# Patient Record
Sex: Female | Born: 1945 | Race: White | Marital: Married | Smoking: Never smoker
Health system: Southern US, Community
[De-identification: ages and names within clinical notes are randomized; demographics above are authoritative.]

## PROBLEM LIST (undated history)

## (undated) DIAGNOSIS — I10 Essential (primary) hypertension: Secondary | ICD-10-CM

## (undated) DIAGNOSIS — M1990 Unspecified osteoarthritis, unspecified site: Secondary | ICD-10-CM

## (undated) DIAGNOSIS — G43909 Migraine, unspecified, not intractable, without status migrainosus: Secondary | ICD-10-CM

## (undated) DIAGNOSIS — K589 Irritable bowel syndrome without diarrhea: Secondary | ICD-10-CM

## (undated) DIAGNOSIS — Z860100 Personal history of colon polyps, unspecified: Secondary | ICD-10-CM

## (undated) DIAGNOSIS — N189 Chronic kidney disease, unspecified: Secondary | ICD-10-CM

## (undated) DIAGNOSIS — Z803 Family history of malignant neoplasm of breast: Secondary | ICD-10-CM

## (undated) DIAGNOSIS — Z808 Family history of malignant neoplasm of other organs or systems: Secondary | ICD-10-CM

## (undated) DIAGNOSIS — G8929 Other chronic pain: Secondary | ICD-10-CM

## (undated) DIAGNOSIS — Z8041 Family history of malignant neoplasm of ovary: Secondary | ICD-10-CM

## (undated) DIAGNOSIS — Z800 Family history of malignant neoplasm of digestive organs: Secondary | ICD-10-CM

## (undated) DIAGNOSIS — M419 Scoliosis, unspecified: Secondary | ICD-10-CM

## (undated) DIAGNOSIS — M545 Low back pain, unspecified: Secondary | ICD-10-CM

## (undated) DIAGNOSIS — R519 Headache, unspecified: Secondary | ICD-10-CM

## (undated) DIAGNOSIS — Z8481 Family history of carrier of genetic disease: Secondary | ICD-10-CM

## (undated) DIAGNOSIS — F419 Anxiety disorder, unspecified: Secondary | ICD-10-CM

## (undated) HISTORY — DX: Headache, unspecified: R51.9

## (undated) HISTORY — PX: BREAST EXCISIONAL BIOPSY: SUR124

## (undated) HISTORY — DX: Low back pain: M54.5

## (undated) HISTORY — DX: Anxiety disorder, unspecified: F41.9

## (undated) HISTORY — DX: Personal history of colon polyps, unspecified: Z86.0100

## (undated) HISTORY — DX: Family history of malignant neoplasm of breast: Z80.3

## (undated) HISTORY — PX: VITRECTOMY: SHX106

## (undated) HISTORY — PX: HIP SURGERY: SHX245

## (undated) HISTORY — DX: Family history of malignant neoplasm of other organs or systems: Z80.8

## (undated) HISTORY — DX: Low back pain, unspecified: M54.50

## (undated) HISTORY — PX: TROCHANTERIC BURSA EXCISION: SHX2581

## (undated) HISTORY — DX: Scoliosis, unspecified: M41.9

## (undated) HISTORY — DX: Other chronic pain: G89.29

## (undated) HISTORY — DX: Family history of malignant neoplasm of digestive organs: Z80.0

## (undated) HISTORY — DX: Chronic kidney disease, unspecified: N18.9

## (undated) HISTORY — DX: Irritable bowel syndrome, unspecified: K58.9

## (undated) HISTORY — PX: APPENDECTOMY: SHX54

## (undated) HISTORY — PX: TUBAL LIGATION: SHX77

## (undated) HISTORY — PX: CATARACT EXTRACTION: SUR2

## (undated) HISTORY — PX: KNEE SURGERY: SHX244

## (undated) HISTORY — DX: Family history of carrier of genetic disease: Z84.81

## (undated) HISTORY — PX: CARPAL TUNNEL RELEASE: SHX101

## (undated) HISTORY — DX: Family history of malignant neoplasm of ovary: Z80.41

## (undated) HISTORY — DX: Personal history of colonic polyps: Z86.010

## (undated) HISTORY — PX: BUNIONECTOMY: SHX129

---

## 2017-01-07 DIAGNOSIS — K3189 Other diseases of stomach and duodenum: Secondary | ICD-10-CM | POA: Diagnosis not present

## 2017-01-07 HISTORY — PX: UPPER GASTROINTESTINAL ENDOSCOPY: SHX188

## 2017-01-07 HISTORY — PX: COLONOSCOPY: SHX5424

## 2017-02-11 DIAGNOSIS — K649 Unspecified hemorrhoids: Secondary | ICD-10-CM | POA: Diagnosis not present

## 2017-10-01 DIAGNOSIS — G43709 Chronic migraine without aura, not intractable, without status migrainosus: Secondary | ICD-10-CM | POA: Diagnosis not present

## 2017-10-20 DIAGNOSIS — R634 Abnormal weight loss: Secondary | ICD-10-CM | POA: Diagnosis not present

## 2017-10-20 DIAGNOSIS — G43909 Migraine, unspecified, not intractable, without status migrainosus: Secondary | ICD-10-CM | POA: Diagnosis not present

## 2017-10-20 DIAGNOSIS — R7989 Other specified abnormal findings of blood chemistry: Secondary | ICD-10-CM | POA: Diagnosis not present

## 2017-10-20 DIAGNOSIS — E559 Vitamin D deficiency, unspecified: Secondary | ICD-10-CM | POA: Diagnosis not present

## 2017-10-20 DIAGNOSIS — N289 Disorder of kidney and ureter, unspecified: Secondary | ICD-10-CM | POA: Diagnosis not present

## 2017-10-20 DIAGNOSIS — R195 Other fecal abnormalities: Secondary | ICD-10-CM | POA: Diagnosis not present

## 2017-10-22 DIAGNOSIS — R7989 Other specified abnormal findings of blood chemistry: Secondary | ICD-10-CM | POA: Diagnosis not present

## 2017-11-02 DIAGNOSIS — Z049 Encounter for examination and observation for unspecified reason: Secondary | ICD-10-CM | POA: Diagnosis not present

## 2017-11-02 DIAGNOSIS — G43719 Chronic migraine without aura, intractable, without status migrainosus: Secondary | ICD-10-CM | POA: Diagnosis not present

## 2017-11-10 DIAGNOSIS — R51 Headache: Secondary | ICD-10-CM | POA: Diagnosis not present

## 2017-11-10 DIAGNOSIS — G43719 Chronic migraine without aura, intractable, without status migrainosus: Secondary | ICD-10-CM | POA: Diagnosis not present

## 2017-11-10 DIAGNOSIS — G518 Other disorders of facial nerve: Secondary | ICD-10-CM | POA: Diagnosis not present

## 2017-11-10 DIAGNOSIS — M791 Myalgia, unspecified site: Secondary | ICD-10-CM | POA: Diagnosis not present

## 2017-11-10 DIAGNOSIS — M542 Cervicalgia: Secondary | ICD-10-CM | POA: Diagnosis not present

## 2017-11-12 ENCOUNTER — Other Ambulatory Visit: Payer: Self-pay | Admitting: Specialist

## 2017-11-12 DIAGNOSIS — M542 Cervicalgia: Secondary | ICD-10-CM

## 2017-11-17 ENCOUNTER — Ambulatory Visit
Admission: RE | Admit: 2017-11-17 | Discharge: 2017-11-17 | Disposition: A | Discharge status: Home / Self Care | Payer: Medicare Other | Source: Ambulatory Visit | Attending: Specialist | Admitting: Specialist

## 2017-11-17 DIAGNOSIS — M4802 Spinal stenosis, cervical region: Secondary | ICD-10-CM | POA: Diagnosis not present

## 2017-11-17 DIAGNOSIS — M542 Cervicalgia: Secondary | ICD-10-CM

## 2017-11-29 DIAGNOSIS — G518 Other disorders of facial nerve: Secondary | ICD-10-CM | POA: Diagnosis not present

## 2017-11-29 DIAGNOSIS — M791 Myalgia, unspecified site: Secondary | ICD-10-CM | POA: Diagnosis not present

## 2017-11-29 DIAGNOSIS — G43719 Chronic migraine without aura, intractable, without status migrainosus: Secondary | ICD-10-CM | POA: Diagnosis not present

## 2017-11-29 DIAGNOSIS — M542 Cervicalgia: Secondary | ICD-10-CM | POA: Diagnosis not present

## 2017-11-29 DIAGNOSIS — R51 Headache: Secondary | ICD-10-CM | POA: Diagnosis not present

## 2017-12-14 DIAGNOSIS — R51 Headache: Secondary | ICD-10-CM | POA: Diagnosis not present

## 2017-12-14 DIAGNOSIS — M791 Myalgia, unspecified site: Secondary | ICD-10-CM | POA: Diagnosis not present

## 2017-12-14 DIAGNOSIS — G43719 Chronic migraine without aura, intractable, without status migrainosus: Secondary | ICD-10-CM | POA: Diagnosis not present

## 2017-12-14 DIAGNOSIS — G518 Other disorders of facial nerve: Secondary | ICD-10-CM | POA: Diagnosis not present

## 2017-12-14 DIAGNOSIS — M542 Cervicalgia: Secondary | ICD-10-CM | POA: Diagnosis not present

## 2017-12-23 ENCOUNTER — Other Ambulatory Visit: Payer: Self-pay | Admitting: Internal Medicine

## 2017-12-23 DIAGNOSIS — R195 Other fecal abnormalities: Secondary | ICD-10-CM | POA: Diagnosis not present

## 2017-12-23 DIAGNOSIS — G43909 Migraine, unspecified, not intractable, without status migrainosus: Secondary | ICD-10-CM | POA: Diagnosis not present

## 2017-12-23 DIAGNOSIS — R634 Abnormal weight loss: Secondary | ICD-10-CM | POA: Diagnosis not present

## 2017-12-23 DIAGNOSIS — N289 Disorder of kidney and ureter, unspecified: Secondary | ICD-10-CM

## 2017-12-23 DIAGNOSIS — E559 Vitamin D deficiency, unspecified: Secondary | ICD-10-CM | POA: Diagnosis not present

## 2018-01-01 ENCOUNTER — Encounter: Payer: Self-pay | Admitting: *Deleted

## 2018-01-01 ENCOUNTER — Emergency Department (INDEPENDENT_AMBULATORY_CARE_PROVIDER_SITE_OTHER): Payer: Medicare Other

## 2018-01-01 ENCOUNTER — Emergency Department (INDEPENDENT_AMBULATORY_CARE_PROVIDER_SITE_OTHER)
Admission: EM | Admit: 2018-01-01 | Discharge: 2018-01-01 | Disposition: A | Discharge status: Home / Self Care | Payer: Medicare Other | Source: Home / Self Care | Attending: Emergency Medicine | Admitting: Emergency Medicine

## 2018-01-01 ENCOUNTER — Other Ambulatory Visit: Payer: Self-pay

## 2018-01-01 DIAGNOSIS — W19XXXA Unspecified fall, initial encounter: Secondary | ICD-10-CM

## 2018-01-01 DIAGNOSIS — M4185 Other forms of scoliosis, thoracolumbar region: Secondary | ICD-10-CM

## 2018-01-01 DIAGNOSIS — S2231XA Fracture of one rib, right side, initial encounter for closed fracture: Secondary | ICD-10-CM

## 2018-01-01 HISTORY — DX: Essential (primary) hypertension: I10

## 2018-01-01 HISTORY — DX: Migraine, unspecified, not intractable, without status migrainosus: G43.909

## 2018-01-01 HISTORY — DX: Unspecified osteoarthritis, unspecified site: M19.90

## 2018-01-01 MED ORDER — KETOROLAC TROMETHAMINE 60 MG/2ML IM SOLN
60.0000 mg | Freq: Once | INTRAMUSCULAR | Status: AC
  Administered 2018-01-01: 60 mg via INTRAMUSCULAR

## 2018-01-01 MED ORDER — TRAMADOL HCL 50 MG PO TABS: 50.0000 mg | ORAL_TABLET | Freq: Four times a day (QID) | ORAL | 0 refills | Status: DC | PRN

## 2018-01-01 MED ORDER — IBUPROFEN 800 MG PO TABS: 800.0000 mg | ORAL_TABLET | Freq: Three times a day (TID) | ORAL | 0 refills | Status: DC | PRN

## 2018-01-01 NOTE — ED Provider Notes (Addendum)
[facility]    CSN: [id number] Arrival date & time: 01/01/18  1541     History   Chief Complaint Chief Complaint  Patient presents with  . Chest Pain    Right    HPI Rebecca Huang is a 72 y.o. female.   The history is provided by the patient.  Chest Pain  Pain location:  R lateral chest Pain quality: sharp   Pain radiates to:  Does not radiate Pain severity:  Severe Onset quality:  Sudden Progression:  Unchanged Chronicity:  New Context: trauma   Context comment:  Accidentally fell from bathtub striking right side chest Relieved by:  Nothing Worsened by:  Deep breathing, movement and certain positions Associated symptoms: no abdominal pain, no altered mental status, no back pain, no cough, no diaphoresis, no dizziness, no dysphagia, no fever, no headache, no lower extremity edema, no nausea, no near-syncope, no numbness, no palpitations, no shortness of breath, no syncope, no vomiting and no weakness   Injury occurred today.  No loss of consciousness.  No other musculoskeletal complaints.  No arm or leg pain.  Denies head or neck injury.  Past Medical History:  Diagnosis Date  . Arthritis   . Hypertension   . Migraine     There are no active problems to display for this patient.   Past Surgical History:  Procedure Laterality Date  . APPENDECTOMY    . HIP SURGERY    . KNEE SURGERY    . TUBAL LIGATION      OB History    No data available       Home Medications    Prior to Admission medications   Medication Sig Start Date End Date Taking? Authorizing Provider  aspirin EC 81 MG tablet Take 81 mg by mouth daily.   Yes [provider]  Calcium Carbonate-Vit D-Min (CALCIUM 1200 PO) Take by mouth.   Yes [provider]  fluticasone (FLONASE) 50 MCG/ACT nasal spray Place into both nostrils daily.   Yes [provider]  hydrochlorothiazide (HYDRODIURIL) 12.5 MG tablet Take 25 mg by mouth daily.   Yes [provider]  Magnesium 400 MG CAPS Take by mouth.   Yes [provider]  Multiple Vitamin (MULTIVITAMIN) tablet Take 1 tablet by mouth daily.   Yes [provider]  Omega-3 Fatty Acids (FISH OIL BURP-LESS PO) Take by mouth.   Yes [provider]  potassium gluconate (TH POTASSIUM GLUCONATE) 595 (99 K) MG TABS tablet Take 595 mg by mouth.   Yes [provider]  Riboflavin (B2) 100 MG TABS Take by mouth.   Yes [provider]  zolmitriptan (ZOMIG) 5 MG tablet Take 5 mg by mouth as needed for migraine.   Yes [provider]  ibuprofen (ADVIL,MOTRIN) 800 MG tablet Take 1 tablet (800 mg total) by mouth every 8 (eight) hours as needed. Take with food. As needed for moderate pain 01/01/18   Jacqulyn Cane, MD  traMADol (ULTRAM) 50 MG tablet Take 1 tablet (50 mg total) by mouth every 6 (six) hours as needed for severe pain. Caution, may cause drowsiness 01/01/18   Jacqulyn Cane, MD    Family History Family History  Problem Relation Age of Onset  . Heart disease Mother   . Heart disease Sister     Social History Social History   Tobacco Use  . Smoking status: Never Smoker  . Smokeless tobacco: Never Used  Substance Use Topics  . Alcohol use: No  Frequency: Never  . Drug use: No     Allergies   Demerol [meperidine hcl]   Review of Systems Review of Systems  Constitutional: Negative for diaphoresis and fever.  HENT: Negative for trouble swallowing.   Respiratory: Negative for cough and shortness of breath.   Cardiovascular: Positive for chest pain. Negative for palpitations, syncope and near-syncope.  Gastrointestinal: Negative for abdominal pain, nausea and vomiting.  Musculoskeletal: Negative for back pain.  Neurological: Negative for dizziness, weakness, numbness and headaches.  All other systems reviewed and are negative.    Physical Exam Triage Vital Signs ED Triage Vitals [01/01/18 1725]  Enc Vitals Group     BP (!) 159/94      Pulse Rate 69     Resp 20     Temp      Temp src      SpO2 98 %     Weight 108 lb (49 kg)     Height      Head Circumference      Peak Flow      Pain Score 10     Pain Loc      Pain Edu?      Excl. in [place]?    No data found.  Updated Vital Signs BP (!) 159/94 (BP Location: Right Arm)   Pulse 69   Resp 20   Wt 108 lb (49 kg)   SpO2 98%   Visual Acuity Right Eye Distance:   Left Eye Distance:   Bilateral Distance:    Right Eye Near:   Left Eye Near:    Bilateral Near:     Physical Exam  Constitutional: She is oriented to person, place, and time. She appears well-developed and well-nourished. No distress.  Very uncomfortable from right-sided chest wall pain.  She splints herself.  No acute cardiorespiratory distress. Oxygen saturation 98 % on room air  HENT:  Head: Normocephalic and atraumatic.  Eyes: Pupils are equal, round, and reactive to light. No scleral icterus.  Neck: Normal range of motion. Neck supple.  Nontender  Cardiovascular: Normal rate and regular rhythm. Exam reveals no gallop.  No murmur heard. Pulmonary/Chest: Effort normal and breath sounds normal. She has no decreased breath sounds. She has no wheezes. She has no rhonchi. She has no rales.  Abdominal: She exhibits no distension.  Musculoskeletal:       Arms: Extremely tender right lateral ribs.  Mild ecchymosis.  Minimal swelling.  No open wound  Neurological: She is alert and oriented to person, place, and time. She has normal strength. No cranial nerve deficit or sensory deficit. Gait normal.  Skin: Skin is warm and dry.  Psychiatric: She has a normal mood and affect. Her behavior is normal.  Vitals reviewed.    UC Treatments / Results  Labs (all labs ordered are listed, but only abnormal results are displayed) Labs Reviewed - No data to display  EKG  EKG Interpretation None       Radiology No results found.  Procedures Procedures (including critical care time)  Medications  Ordered in UC Medications  ketorolac (TORADOL) injection 60 mg (60 mg Intramuscular Given 01/01/18 1723)     Initial Impression / Assessment and Plan / UC Course  I have reviewed the triage vital signs and the nursing notes.  Pertinent labs & imaging results that were available during my care of the patient were reviewed by me and considered in my medical decision making (see chart for details).     Slightly  displaced, closed fracture right lateral eighth rib. Reviewed x-ray and diagnosis with patient. Anticipatory guidance discussed. Treatment options discussed, as well as risks, benefits, alternatives. Patient voiced understanding and agreement with the following plans: Toradol 60 mg IM stat.  She was observed and pain level decreased from 10 out of 10 to 6 out of 10.   Final Clinical Impressions(s) / UC Diagnoses   Final diagnoses:  Closed fracture of one rib of right side, initial encounter    ED Discharge Orders        Ordered    ibuprofen (ADVIL,MOTRIN) 800 MG tablet  Every 8 hours PRN     01/01/18 1807    traMADol (ULTRAM) 50 MG tablet  Every 6 hours PRN     01/01/18 1807     An After Visit Summary was printed and given to the patient. You have "slightly displaced fracture right lateral eighth rib". This can take 8-12+ weeks to completely heal, but the severe pain will likely improve within one week. For severe pain, printed prescription for tramadol, use if needed for severe pain. For moderate pain, prescription for ibuprofen 800 mg, one every 8 hours with food as needed. Wear rib belt for pain relief. Apply ice for 24-48 hours. Please read attached instruction sheet on rib fracture. Follow-up with your doctor in 5 days, or go to emergency room sooner if any severe/worse symptoms.  ibuprofen 800 MG tablet    Take 1 tablet (800 mg total) by mouth every 8 (eight) hours as needed. Take with food. As needed for moderate pain     traMADol 50 MG tablet    #15  No  RF's  Take 1 tablet (50 mg total) by mouth every 6 (six) hours as needed for severe pain. Caution, may cause drowsiness    Before prescribing tramadol, I reviewed her drug allergies.  In the past, Demerol IM postop was associated with nausea and vomiting, however she is states she has taken tramadol in the past without any side effects or reactions.  Controlled Substance Prescriptions [place] Controlled Substance Registry consulted? Yes, I have consulted the [place] Controlled Substances Registry for this patient, and feel the risk/benefit ratio today is favorable for proceeding with this prescription for a controlled substance.   Jacqulyn Cane, MD 01/03/18 Reatha Armour, MD 01/03/18 Penni Bombard    Jacqulyn Cane, MD 01/03/18 [phone]

## 2018-01-01 NOTE — ED Triage Notes (Signed)
Patient reports trying to hang something above her bathtub and fell onto a towel rack. C/o pain at right lateral ribs.

## 2018-01-01 NOTE — Discharge Instructions (Signed)
You have "slightly displaced fracture right lateral eighth rib". This can take 8-12 +weeks to completely heal, but the severe pain will likely improve within one week. For severe pain, printed prescription for tramadol, use if needed for severe pain. For moderate pain, prescription for ibuprofen 800 mg, one every 8 hours with food as needed. Wear rib belt for pain relief. Apply ice for 24-48 hours. Please read attached instruction sheet on rib fracture. Follow-up with your doctor in 5 days, or go to emergency room sooner if any severe/worse symptoms.

## 2018-01-03 ENCOUNTER — Other Ambulatory Visit: Payer: PRIVATE HEALTH INSURANCE

## 2018-01-03 DIAGNOSIS — S2239XA Fracture of one rib, unspecified side, initial encounter for closed fracture: Secondary | ICD-10-CM | POA: Diagnosis not present

## 2018-01-06 ENCOUNTER — Ambulatory Visit
Admission: RE | Admit: 2018-01-06 | Discharge: 2018-01-06 | Disposition: A | Discharge status: Home / Self Care | Payer: Medicare Other | Source: Ambulatory Visit | Attending: Internal Medicine | Admitting: Internal Medicine

## 2018-01-06 DIAGNOSIS — N2889 Other specified disorders of kidney and ureter: Secondary | ICD-10-CM | POA: Diagnosis not present

## 2018-01-06 DIAGNOSIS — N289 Disorder of kidney and ureter, unspecified: Secondary | ICD-10-CM

## 2018-01-27 ENCOUNTER — Ambulatory Visit (INDEPENDENT_AMBULATORY_CARE_PROVIDER_SITE_OTHER): Payer: Medicare Other | Admitting: Neurology

## 2018-01-27 ENCOUNTER — Telehealth: Payer: Self-pay | Admitting: Neurology

## 2018-01-27 ENCOUNTER — Encounter: Payer: Self-pay | Admitting: Neurology

## 2018-01-27 VITALS — BP 148/98 | HR 72 | Ht 64.5 in | Wt 106.0 lb

## 2018-01-27 DIAGNOSIS — G43019 Migraine without aura, intractable, without status migrainosus: Secondary | ICD-10-CM

## 2018-01-27 MED ORDER — ZOLMITRIPTAN 5 MG PO TABS: 5.0000 mg | ORAL_TABLET | ORAL | 5 refills | Status: DC | PRN

## 2018-01-27 NOTE — Patient Instructions (Addendum)
We are going to request insurance authorization for Botox injections for your intractable migraine headaches. In the meantime, please continue to take Zomig as needed and be cautious with the use of over the counter headache medications.  You are on a low dose Zomig. I will refill it at 5 mg: take 1 pill early on when you suspect a migraine attack come on. You may take another pill within 2 hours, no more than 2 pills in 24 hours. No more than 3 pills in a week. Most people who take triptans do not have any serious side-effects. However, they can cause drowsiness (remember to not drive or use heavy machinery when drowsy), nausea, dizziness, dry mouth. Less common side effects include strange sensations, such as tightness in your chest or throat, tingling, flushing, and feelings of heaviness or pressure in areas such as the face, limbs, and chest. These in the chest can mimic heart related pain (angina) and may cause alarm, but usually these sensations are not harmful or a sign of a heart attack. However, if you develop intense chest pain or sensations of discomfort, you should stop taking your medication and consult with me or your PCP or go to the nearest urgent care facility or ER or call 911.

## 2018-01-27 NOTE — Telephone Encounter (Signed)
I called the patient's insurance company of [employer], they follow medicare guidelines Ref#[id number] 01/27/18.

## 2018-01-27 NOTE — Progress Notes (Signed)
Subjective:    Patient ID: Rebecca Huang is a 72 y.o. female.  HPI     Star Age, MD, PhD [facility] [address] [place], [place] [address]  Dear Dr. Inda Merlin,   I saw your patient, Rebecca Huang, upon your kind request, in my clinic today for initial consultation of her recurrent headaches, concern for migraines. The patient is unaccompanied today. As you know, Rebecca Huang is a 72 year old right-handed woman with an underlying medical history of kidney disease, irritable bowel syndrome, scoliosis, low back pain, b/l CTS, allergic rhinitis, underweight state and chronic dry eyes, who reports a longstanding History of migraine headaches probably for childhood as she had headaches and significant nausea and some vomiting as she recalls when she was a child. She also has a family history of migraines including her mother, her sister, and her son. She has one daughter who is the older one and one son. She recently retired. She quit smoking in 1975. She lives with her husband of 31 years. She drinks alcohol rarely, likes hot tea in the morning and occasional Diet Coke. I reviewed your office note from 12/23/2017. She has previously received Botox injections for her migraine headaches. She has a long-standing history of migraine headaches and has tried and failed multiple preventative and abortive medications. As far as her preventative medications, she has tried Topamax, Depakote, propranolol, verapamil. as far as abortive medications, she has been on Imitrex in the past including injections and by mouth as well as Maxalt. Currently she is on Zomig. She has received botulinum toxin injections. [place] seeing Dr. Domingo Cocking at the headache clinic. His clinic does not accept her insurance for Botox injections any longer. She has tried trigger point injections and has not had much success. Last Botox injection was in December.   Migraines were infrequent some decades ago, were up to more than 15 HA days per month before she started having the Botox injections some 6 years ago, while still in [place]. She moved with her husband to [place] end of Dec.  She has had what sounds like right sided occipital neuralgic pain, and extra strength excedrin When necessary is quite helpful for this. She has had trigger point injections repeatedly which have not been helpful. Her last Botox injection was in December 2018. She is worried about getting a flareup of her migraines because she is overdue for her Botox injections. This is completely different from her migraine, which is usually L sided and wo aura. She has had some stressors recently. Has lost weight in the last year. Husband recently had to have triple bypass, but thankfully has been doing well.    Her Past Medical History Is Significant For: Past Medical History:  Diagnosis Date  . Arthritis   . Chronic kidney disease   . Chronic low back pain   . Hypertension   . IBS (irritable bowel syndrome)   . Migraine   . Scoliosis     Her Past Surgical History Is Significant For: Past Surgical History:  Procedure Laterality Date  . APPENDECTOMY    . HIP SURGERY    . KNEE SURGERY    . TUBAL LIGATION      Her Family History Is Significant For: Family History  Problem Relation Age of Onset  . Heart disease Mother   . Heart disease Sister     Her Social History Is Significant For: Social History   Socioeconomic  History  . Marital status: Married    Spouse name: Not on file  . Number of children: Not on file  . Years of education: Not on file  . Highest education level: Not on file  Occupational History  . Not on file  Social Needs  . Financial resource strain: Not on file  . Food insecurity:    Worry: Not on file    Inability: Not on file  . Transportation needs:    Medical: Not on file    Non-medical: Not on file  Tobacco Use  . Smoking status:  Never Smoker  . Smokeless tobacco: Never Used  Substance and Sexual Activity  . Alcohol use: No    Frequency: Never  . Drug use: No  . Sexual activity: Not on file  Lifestyle  . Physical activity:    Days per week: Not on file    Minutes per session: Not on file  . Stress: Not on file  Relationships  . Social connections:    Talks on phone: Not on file    Gets together: Not on file    Attends religious service: Not on file    Active member of club or organization: Not on file    Attends meetings of clubs or organizations: Not on file    Relationship status: Not on file  Other Topics Concern  . Not on file  Social History Narrative  . Not on file    Her Allergies Are:  Allergies  Allergen Reactions  . Demerol [Meperidine Hcl]   . Tramadol   :   Her Current Medications Are:  Outpatient Encounter Medications as of 01/27/2018  Medication Sig  . aspirin EC 81 MG tablet Take 81 mg by mouth daily.  . Calcium Carbonate-Vit D-Min (CALCIUM 1200 PO) Take by mouth.  . fluticasone (FLONASE) 50 MCG/ACT nasal spray Place into both nostrils daily.  . hydrochlorothiazide (HYDRODIURIL) 25 MG tablet Take 25 mg by mouth daily.  . Magnesium 400 MG CAPS Take by mouth.  . Multiple Vitamin (MULTIVITAMIN) tablet Take 1 tablet by mouth daily.  . Omega-3 Fatty Acids (FISH OIL BURP-LESS PO) Take by mouth.  . potassium chloride (K-DUR,KLOR-CON) 10 MEQ tablet Take 10 mEq by mouth daily.  . Riboflavin (B2) 100 MG TABS Take by mouth.  . zolmitriptan (ZOMIG) 5 MG tablet Take 5 mg by mouth as needed for migraine.  . [DISCONTINUED] ibuprofen (ADVIL,MOTRIN) 800 MG tablet Take 1 tablet (800 mg total) by mouth every 8 (eight) hours as needed. Take with food. As needed for moderate pain  . [DISCONTINUED] traMADol (ULTRAM) 50 MG tablet Take 1 tablet (50 mg total) by mouth every 6 (six) hours as needed for severe pain. Caution, may cause drowsiness   No facility-administered encounter medications on file as of  01/27/2018.   :   Review of Systems:  Out of a complete 14 point review of systems, all are reviewed and negative with the exception of these symptoms as listed below:  Review of Systems  Neurological:       Pt presents today to discuss her migraines. Pt has been getting botox for her migraines. Last injections was in December with Dr. Domingo Cocking. Pt has also been trying trigger point injections with Dr. Domingo Cocking which have not helped. Pt has tried and failed multiple medications for her migraines, including topamax, depakote, propranolol, verapamil, imitrex pill and injections, and maxalt. Pt is using zomig currently. Pt is also complaining of neck pain.    Objective:  Neurological Exam  Physical Exam Physical Examination:   Vitals:   01/27/18 1002  BP: (!) 148/98  Pulse: 72    General Examination: The patient is a very pleasant 72 y.o. female in no acute distress. She appears there are slender. She is very well groomed.  HEENT: Normocephalic, atraumatic, pupils are equal, round and reactive to light and accommodation. Extraocular tracking is good without limitation to gaze excursion or nystagmus noted. Normal smooth pursuit is noted. Hearing is grossly intact. Face is symmetric with normal facial animation and normal facial sensation. Speech is clear with no dysarthria noted. There is no hypophonia. There is no lip, neck/head, jaw or voice tremor. Neck is supple with full range of passive and active motion. There are no carotid bruits on auscultation. Oropharynx exam reveals: mild mouth dryness, good dental hygiene. Mallampati is class I. Tongue protrudes centrally and palate elevates symmetrically.   Chest: Clear to auscultation without wheezing, rhonchi or crackles noted.  Heart: S1+S2+0, regular and normal without murmurs, rubs or gallops noted.   Abdomen: Soft, non-tender and non-distended with normal bowel sounds appreciated on auscultation.  Extremities: There is no pitting edema  in the distal lower extremities bilaterally.  Skin: Warm and dry without trophic changes noted.  Musculoskeletal: exam reveals mild scoliosis, right hip higher than L.   Neurologically:  Mental status: The patient is awake, alert and oriented in all 4 spheres. Her immediate and remote memory, attention, language skills and fund of knowledge are appropriate. There is no evidence of aphasia, agnosia, apraxia or anomia. Speech is clear with normal prosody and enunciation. Thought process is linear. Mood is normal and affect is normal.  Cranial nerves II - XII are as described above under HEENT exam. In addition: shoulder shrug is normal with equal shoulder height noted. Motor exam: Normal bulk, strength and tone is noted. There is no drift, tremor or rebound. Romberg is negative. Reflexes are 1+. Fine motor skills and coordination: grossly intact with normal finger taps, normal hand movements, normal rapid alternating patting, normal foot taps and normal foot agility.  Cerebellar testing: No dysmetria or intention tremor on finger to nose testing. Heel to shin is unremarkable bilaterally. There is no truncal or gait ataxia.  Sensory exam: intact to light touch in the upper and lower extremities.  Gait, station and balance: She stands easily. No veering to one side is noted. No leaning to one side is noted. Posture is age-appropriate mild scoliosis is evident. Gait is unremarkable.  Assessment and Plan:   In summary, Rebecca Huang is a very pleasant 72 y.o.-year old female with an underlying medical history of kidney disease, irritable bowel syndrome, scoliosis, low back pain, b/l CTS, allergic rhinitis, underweight state and chronic dry eyes, who Presents for neurologic consultation of her migraine headaches. She has a long-standing history of migraine without aura. She has had significant headache frequency several years ago until she started Botox injections successfully. She has been receiving Botox  injections for the past 6 years or so, last injection in December, before she moved to [place]. Written informed consent was obtained today for resuming Botox injections for intractable migraine headaches without aura without status migrainosus. She has tried and failed multiple abortive and preventive medications which are listed above. She has some success with Zomig as needed. She needed a refill today which I provided. She is advised to be cautious with triptan medications for fear of side effects and given her age. Nevertheless, she has been able to tolerate  this well. She has never had any triptan related side effects. She is also cautioned about the use of over-the-counter anti-inflammatory medications and avoids Motrin as she gets a upset stomach after taking Motrin. Nevertheless, Excedrin Migraine extra strength has helped with her neck and occipital pain. From her description, she likely has intermittent issues with right occipital neuralgia. Unfortunately, trigger point injections have not helped with his either. We talked about side effects with Botox injections, potential limitations, expectations at length today. She is advised that we will try to get her in as soon as possible for her first round of injections. She is advised to stay well hydrated and well rested. Stress reduction is important. She is advised to monitor her weight. Headache triggers are also discussed with her today. I answered all her questions today and she was in agreement with the plan. Thank you very much for allowing me to participate in the care of this nice patient. If I can be of any further assistance to you please do not hesitate to call me at [phone].  Sincerely,   Star Age, MD, PhD

## 2018-01-31 ENCOUNTER — Ambulatory Visit (INDEPENDENT_AMBULATORY_CARE_PROVIDER_SITE_OTHER): Payer: Medicare Other | Admitting: Neurology

## 2018-01-31 ENCOUNTER — Encounter: Payer: Self-pay | Admitting: Neurology

## 2018-01-31 ENCOUNTER — Telehealth: Payer: Self-pay | Admitting: Neurology

## 2018-01-31 VITALS — BP 148/91 | HR 74 | Ht 64.5 in | Wt 107.0 lb

## 2018-01-31 DIAGNOSIS — G43019 Migraine without aura, intractable, without status migrainosus: Secondary | ICD-10-CM | POA: Diagnosis not present

## 2018-01-31 MED ORDER — ONABOTULINUMTOXINA 100 UNITS IJ SOLR
100.0000 [IU] | Freq: Once | INTRAMUSCULAR | Status: AC
  Administered 2018-01-31: 100 [IU] via INTRAMUSCULAR

## 2018-01-31 MED ORDER — ONABOTULINUMTOXINA 100 UNITS IJ SOLR: 200.0000 [IU] | Freq: Once | INTRAMUSCULAR | Status: DC

## 2018-01-31 NOTE — Addendum Note (Signed)
Addended by: Star Age on: 01/31/2018 10:18 AM   Modules accepted: Orders

## 2018-01-31 NOTE — Patient Instructions (Signed)
Please remember, botulinum toxin takes about 3-7 days to kick in. As discussed, this is not a pain shot. The purpose of the injections is to gradually improve your symptoms. In some patients it takes up to 2-3 weeks to make a difference and it wears off with time. Sometimes it may wear off before it is time for the next injection. We still should wait till the next 3 monthly injection, because injecting too frequently may cause you to develop immunity to the botulinum toxin. We are looking for a reduction in the severity and/or frequency of your symptoms. As a reminder, side effects to look out for are (but not limited to): mouth dryness, dryness of the eyes, heaviness of your head or muscle weakness, including droopy face or droopy eyelid(s), rarely: speech or swallowing difficulties and very rarely: breathing difficulties. Some people have transient neck pain or soreness which typically responds to over-the-counter anti-inflammatory medication and local heat application with a heat pad. If you think you have a severe reaction to the botulinum toxin, such as weakness, trouble speaking, trouble breathing, or trouble swallowing, you have to call 911 or have someone take you to the nearest emergency room. However, most people have either no or minimal side effects from the injections. It is normal to have a little bit of redness and swelling around the injection sites which usually improves after a few hours. Rarely, there may be a bruise that improves on its own. Most side effects reported are very mild and resolve within 10-14 days. Please feel free to call us if you have any additional questions or concerns: [phone] or email us through My Chart. We may have to adjust the dose over time, depending on your results from this injection and your overall response over time to this medication.   

## 2018-01-31 NOTE — Progress Notes (Signed)
Ms. Rebecca Huang is a 72 year old right-handed woman with an underlying medical history of kidney disease, irritable bowel syndrome, scoliosis, low back pain, b/l CTS, allergic rhinitis, underweight state and chronic dry eyes, who presents for initial botulinum toxin injections for her intractable migraine headaches. The patient unaccompanied today. I first met her on 01/27/2018, at which time she reported a long-standing history of migraine headaches since essentially childhood. She had been tried on multiple abortive and preventive medications over the years. She had finally had success with recurrent Botox injections which she had been receiving for the past 6 years or so, last injection in December 2018 before she moved to [place] from [place]. Prior to starting Botox injections, she had at least 15 headache days per month, in fact, nearly daily at one point.  I talked to the patient in detail about expectations, limitations, benefits as well as potential adverse effects of botulinum toxin injections. The patient understands that the side effects include (but are not limited to): Mouth dryness, dryness of eyes, speech and swallowing difficulties, respiratory depression or problems breathing, weakness of muscles including more distant muscles than the ones injected, flu-like symptoms, myalgias, injection site reactions such as redness, itching, swelling, pain, and infection.  200 units of botulinum toxin type A were reconstituted using preservative-free normal saline to a concentration of 10 units per 0.1 mL and drawn up into 1 mL tuberculin syringes. Lot number was [id number] and expiration date was 06/2020 for both 100 unit vials, [employer].  O/E: BP (!) 148/91   Pulse 74   Ht 5' 4.5" (1.638 m)   Wt 107 lb (48.5 kg)   BMI 18.08 kg/m    She reports that she woke up with a migraine and also flareup in her right occipital pain last night. She reports that Excedrin Migraine helps the sharp pain but not  her migraine. She is advised that because she is overdue for her injection it may have something to do with a flareup of her to types of pains. We will just have to wait it out. She is agreeable.  The patient was situated in a chair, sitting comfortably. After preparing the areas with 70% isopropyl alcohol and using a 26 gauge 1 1/2 inch hollow lumen recording EMG needle for the neck injections as well as a 30 gauge 1 inch needle for the facial injections, a total dose of 155 units of botulinum toxin type A in the form of Botox was injected into the muscles and the following distribution and quantities:  #1: 10 units on the right and 10 units in the left frontalis muscles, broken down in 2 sites on each side. #2: 5 units in the right and 5 units in the left corrugator muscles. #3: 15 units in the right and 15 units in the left occipitalis muscles, broken down in 3 sites on each side. #4: 20 units in the right and 20 units in the left temporalis muscles, broken down in 4 sites on each side.  #5: 15 units on the right and 15 units in the left upper trapezius muscles, broken down in 3 sites on each side. #6: 10 units in the right and 10 units in the left splenius capitis muscles, broken down in 2 sites on each side.  #7: 2.5 units in the right and 2.5 units in the left procerus muscles.  EMG guidance was utilized for the neck injections with mild EMG activity noted In the right splenius capitis areas.  A dose of  45 units out of a total dose of 200 units was discarded as unavoidable waste.   The patient tolerated the procedure well without immediate complications. She was advised to make a followup appointment for repeat injections in 3 months from now and encouraged to call us with any interim questions, concerns, problems, or updates. She was in agreement and did not have any questions prior to leaving clinic today.  Previously (copied from previous notes for reference):   01/27/2018: 72 year old  right-handed woman with an underlying medical history of kidney disease, irritable bowel syndrome, scoliosis, low back pain, b/l CTS, allergic rhinitis, underweight state and chronic dry eyes, who reports a longstanding History of migraine headaches probably for childhood as she had headaches and significant nausea and some vomiting as she recalls when she was a child. She also has a family history of migraines including her mother, her sister, and her son. She has one daughter who is the older one and one son. She recently retired. She quit smoking in 1975. She lives with her husband of 80 years. She drinks alcohol rarely, likes hot tea in the morning and occasional Diet Coke. I reviewed your office note from 12/23/2017. She has previously received Botox injections for her migraine headaches. She has a long-standing history of migraine headaches and has tried and failed multiple preventative and abortive medications. As far as her preventative medications, she has tried Topamax, Depakote, propranolol, verapamil. as far as abortive medications, she has been on Imitrex in the past including injections and by mouth as well as Maxalt. Currently she is on Zomig. She has received botulinum toxin injections. [place] and [place] seeing Dr. Domingo Cocking at the headache clinic. His clinic does not accept her insurance for Botox injections any longer. She has tried trigger point injections and has not had much success. Last Botox injection was in December.  Migraines were infrequent some decades ago, were up to more than 15 HA days per month before she started having the Botox injections some 6 years ago, while still in [place]. She moved with her husband to [place] end of Dec.  She has had what sounds like right sided occipital neuralgic pain, and extra strength excedrin When necessary is quite helpful for this. She has had trigger point injections repeatedly which have not been helpful. Her last Botox  injection was in December 2018. She is worried about getting a flareup of her migraines because she is overdue for her Botox injections. This is completely different from her migraine, which is usually L sided and wo aura. She has had some stressors recently. Has lost weight in the last year. Husband recently had to have triple bypass, but thankfully has been doing well.

## 2018-01-31 NOTE — Telephone Encounter (Signed)
12 wk Botox inj °

## 2018-02-03 NOTE — Telephone Encounter (Signed)
I called and scheduled the patient.  °

## 2018-02-24 DIAGNOSIS — S2239XA Fracture of one rib, unspecified side, initial encounter for closed fracture: Secondary | ICD-10-CM | POA: Diagnosis not present

## 2018-02-24 DIAGNOSIS — R634 Abnormal weight loss: Secondary | ICD-10-CM | POA: Diagnosis not present

## 2018-02-24 DIAGNOSIS — R195 Other fecal abnormalities: Secondary | ICD-10-CM | POA: Diagnosis not present

## 2018-02-24 DIAGNOSIS — N289 Disorder of kidney and ureter, unspecified: Secondary | ICD-10-CM | POA: Diagnosis not present

## 2018-02-24 DIAGNOSIS — G43909 Migraine, unspecified, not intractable, without status migrainosus: Secondary | ICD-10-CM | POA: Diagnosis not present

## 2018-02-24 DIAGNOSIS — E559 Vitamin D deficiency, unspecified: Secondary | ICD-10-CM | POA: Diagnosis not present

## 2018-03-14 ENCOUNTER — Telehealth: Payer: Self-pay | Admitting: Neurology

## 2018-03-14 DIAGNOSIS — M542 Cervicalgia: Secondary | ICD-10-CM

## 2018-03-14 DIAGNOSIS — G43019 Migraine without aura, intractable, without status migrainosus: Secondary | ICD-10-CM

## 2018-03-14 NOTE — Telephone Encounter (Signed)
I called pt. She has a headache that starts at the base of her ear, and radiates upwards. Pt feels that this pain is "coming from my cervical issues." Pt is having to take almost daily excedrin migraine. Pt has noticed that she is taking more zomig as well. Pt was seeing a neurologist in [place] that was working with her on her neck pain. Pt reports that she has had years of botox injections, which are going well. Pt is wanting another medication to help with this daily headache.

## 2018-03-14 NOTE — Telephone Encounter (Signed)
Her neck MRI from January 2019 ordered by Dr. Domingo Cocking showed degenerative changes. I would recommend that she talk to her primary care physician about seeing a spine specialist.  We can also make a referral to neurosurgery or orthopedics if she prefers.

## 2018-03-14 NOTE — Telephone Encounter (Signed)
Pt called she having pain in the base of the head and is having to take Excedrin Migraine or extra strength Excedrin on a daily basis ocassionally it does not help. This was mentioned at the [facility] in April. She is wanting to discuss a medication that might help. She is not interested in any narcotic medication. Please call to advise.

## 2018-03-14 NOTE — Telephone Encounter (Signed)
I called pt and explained Dr. Guadelupe Sabin recommendations. Pt wants to discuss the referral with her family and will call us back with her decision regarding a referral.

## 2018-03-16 NOTE — Telephone Encounter (Signed)
Pt has called back to inform she wants to go with Dr Orma Render [phone].  Pt is aware it may take a while to get in with him, she states she is willing to wait.

## 2018-03-16 NOTE — Addendum Note (Signed)
Addended by: Lester [place] A on: 03/16/2018 11:55 AM   Modules accepted: Orders

## 2018-03-16 NOTE — Telephone Encounter (Signed)
VO received from Dr. Rexene Alberts to refer pt to Dr. Vertell Limber at [facility]. Order placed.

## 2018-03-17 NOTE — Telephone Encounter (Signed)
Referral was sent  To Neurosurgery.

## 2018-04-01 DIAGNOSIS — H04123 Dry eye syndrome of bilateral lacrimal glands: Secondary | ICD-10-CM | POA: Diagnosis not present

## 2018-04-01 DIAGNOSIS — H524 Presbyopia: Secondary | ICD-10-CM | POA: Diagnosis not present

## 2018-04-01 DIAGNOSIS — H26493 Other secondary cataract, bilateral: Secondary | ICD-10-CM | POA: Diagnosis not present

## 2018-05-05 ENCOUNTER — Telehealth: Payer: Self-pay | Admitting: Neurology

## 2018-05-05 ENCOUNTER — Ambulatory Visit (INDEPENDENT_AMBULATORY_CARE_PROVIDER_SITE_OTHER): Payer: Medicare Other | Admitting: Neurology

## 2018-05-05 ENCOUNTER — Encounter: Payer: Self-pay | Admitting: Neurology

## 2018-05-05 VITALS — BP 130/84 | HR 72 | Ht 64.5 in | Wt 110.0 lb

## 2018-05-05 DIAGNOSIS — G43019 Migraine without aura, intractable, without status migrainosus: Secondary | ICD-10-CM

## 2018-05-05 MED ORDER — ONABOTULINUMTOXINA 100 UNITS IJ SOLR
200.0000 [IU] | Freq: Once | INTRAMUSCULAR | Status: AC
  Administered 2018-05-05: 200 [IU] via INTRAMUSCULAR

## 2018-05-05 NOTE — Progress Notes (Signed)
Rebecca Huang is a 72 year old right-handed woman with an underlying medical history of kidney disease, irritable bowel syndrome, scoliosis, low back pain, b/l CTS, allergic rhinitis, underweight state and chronic dry eyes, who presents for her second round of botulinum toxin injections for her intractable migraine headaches. The patient is unaccompanied today. I last saw her on 01/31/2018 for initial botulinum toxin injections, at which time she received 155 units of botulinum toxin type A for migraines.   I talked to the patient in detail about expectations, limitations, benefits as well as potential adverse effects of botulinum toxin injections. The patient understands that the side effects include (but are not limited to): Mouth dryness, dryness of eyes, speech and swallowing difficulties, respiratory depression or problems breathing, weakness of muscles including more distant muscles than the ones injected, flu-like symptoms, myalgias, injection site reactions such as redness, itching, swelling, pain, and infection.   200 units of botulinum toxin type A were reconstituted using preservative-free normal saline to a concentration of 10 units per 0.1 mL and drawn up into 1 mL tuberculin syringes. Lot number was [id number] and expiration date was 08/2020 for both 100 unit vials, [employer].   O/E: BP 130/84   Pulse 72   Ht 5' 4.5" (1.638 m)   Wt 110 lb (49.9 kg)   BMI 18.59 kg/m    She reports doing okay, felt a recent flare in the Migraine, took extra strength excedrin yesterday and is on daily ASA.  The patient was situated in a chair, sitting comfortably. After preparing the areas with 70% isopropyl alcohol and using a 26 gauge 1 1/2 inch hollow lumen recording EMG needle for the neck injections as well as a 30 gauge 1 inch needle for the facial injections, a total dose of 155 units of botulinum toxin type A in the form of Botox was injected into the muscles and the following distribution and  quantities:   #1: 10 units on the right and 10 units in the left frontalis muscles, broken down in 2 sites on each side. #2: 5 units in the right and 5 units in the left corrugator muscles. #3: 15 units in the right and 15 units in the left occipitalis muscles, broken down in 3 sites on each side. #4: 20 units in the right and 20 units in the left temporalis muscles, broken down in 4 sites on each side.  #5: 15 units on the right and 15 units in the left upper trapezius muscles, broken down in 3 sites on each side. #6: 10 units in the right and 10 units in the left splenius capitis muscles, broken down in 2 sites on each side.  #7: 2.5 units in the right and 2.5 units in the left procerus muscles.   EMG guidance was utilized for the neck injections with mild EMG activity noted In the left more than right trap and splenius capitis areas.  A dose of 45 units out of a total dose of 200 units was discarded as unavoidable waste.   The patient tolerated the procedure well without immediate complications. She was advised to make a followup appointment for repeat injections in 3 months from now and encouraged to call us with any interim questions, concerns, problems, or updates. She was in agreement and did not have any questions prior to leaving clinic today.   Previously (copied from previous notes for reference):   I first met her on 01/27/2018, at which time she reported a long-standing history of migraine  headaches since essentially childhood. She had been tried on multiple abortive and preventive medications over the years. She had finally had success with recurrent Botox injections which she had been receiving for the past 6 years or so, last injection in December 2018 before she moved to [place] from [place]. Prior to starting Botox injections, she had at least 15 headache days per month, in fact, nearly daily at one point.   01/27/2018: 72 year old right-handed woman with an underlying medical  history of kidney disease, irritable bowel syndrome, scoliosis, low back pain, b/l CTS, allergic rhinitis, underweight state and chronic dry eyes, who reports a longstanding History of migraine headaches probably for childhood as she had headaches and significant nausea and some vomiting as she recalls when she was a child. She also has a family history of migraines including her mother, her sister, and her son. She has one daughter who is the older one and one son. She recently retired. She quit smoking in 1975. She lives with her husband of 23 years. She drinks alcohol rarely, likes hot tea in the morning and occasional Diet Coke. I reviewed your office note from 12/23/2017. She has previously received Botox injections for her migraine headaches. She has a long-standing history of migraine headaches and has tried and failed multiple preventative and abortive medications. As far as her preventative medications, she has tried Topamax, Depakote, propranolol, verapamil. as far as abortive medications, she has been on Imitrex in the past including injections and by mouth as well as Maxalt. Currently she is on Zomig. She has received botulinum toxin injections. [place] and [place] seeing Dr. Domingo Cocking at the headache clinic. His clinic does not accept her insurance for Botox injections any longer. She has tried trigger point injections and has not had much success. Last Botox injection was in December.  Migraines were infrequent some decades ago, were up to more than 15 HA days per month before she started having the Botox injections some 6 years ago, while still in [place]. She moved with her husband to [place] end of Dec.  She has had what sounds like right sided occipital neuralgic pain, and extra strength excedrin When necessary is quite helpful for this. She has had trigger point injections repeatedly which have not been helpful. Her last Botox injection was in December 2018. She is  worried about getting a flareup of her migraines because she is overdue for her Botox injections. This is completely different from her migraine, which is usually L sided and wo aura. She has had some stressors recently. Has lost weight in the last year. Husband recently had to have triple bypass, but thankfully has been doing well.

## 2018-05-05 NOTE — Telephone Encounter (Signed)
Pt. Needs 12 wk botox inj

## 2018-05-05 NOTE — Patient Instructions (Signed)
Please remember, botulinum toxin takes about 3-7 days to kick in. As discussed, this is not a pain shot. The purpose of the injections is to gradually improve your symptoms. In some patients it takes up to 2-3 weeks to make a difference and it wears off with time. Sometimes it may wear off before it is time for the next injection. We still should wait till the next 3 monthly injection, because injecting too frequently may cause you to develop immunity to the botulinum toxin. We are looking for a reduction in the severity and/or frequency of your symptoms. As a reminder, side effects to look out for are (but not limited to): mouth dryness, dryness of the eyes, heaviness of your head or muscle weakness, including droopy face or droopy eyelid(s), rarely: speech or swallowing difficulties and very rarely: breathing difficulties. Some people have transient neck pain or soreness which typically responds to over-the-counter anti-inflammatory medication and local heat application with a heat pad. If you think you have a severe reaction to the botulinum toxin, such as weakness, trouble speaking, trouble breathing, or trouble swallowing, you have to call 911 or have someone take you to the nearest emergency room. However, most people have either no or minimal side effects from the injections. It is normal to have a little bit of redness and swelling around the injection sites which usually improves after a few hours. Rarely, there may be a bruise that improves on its own. Most side effects reported are very mild and resolve within 10-14 days. Please feel free to call us if you have any additional questions or concerns: [phone] or email us through My Chart. We may have to adjust the dose over time, depending on your results from this injection and your overall response over time to this medication.   

## 2018-05-09 NOTE — Telephone Encounter (Signed)
I called to schedule the patient for her next injection. She answered but said she was out and could not schedule the apt at the time but said she would call us back to schedule. DW

## 2018-05-10 DIAGNOSIS — Z9889 Other specified postprocedural states: Secondary | ICD-10-CM | POA: Diagnosis not present

## 2018-05-10 DIAGNOSIS — M79672 Pain in left foot: Secondary | ICD-10-CM | POA: Diagnosis not present

## 2018-05-18 ENCOUNTER — Encounter: Payer: Self-pay | Admitting: Podiatry

## 2018-05-18 ENCOUNTER — Ambulatory Visit (INDEPENDENT_AMBULATORY_CARE_PROVIDER_SITE_OTHER): Payer: Medicare Other

## 2018-05-18 ENCOUNTER — Ambulatory Visit (INDEPENDENT_AMBULATORY_CARE_PROVIDER_SITE_OTHER): Payer: Medicare Other | Admitting: Podiatry

## 2018-05-18 ENCOUNTER — Other Ambulatory Visit: Payer: Self-pay

## 2018-05-18 ENCOUNTER — Other Ambulatory Visit: Payer: Self-pay | Admitting: Podiatry

## 2018-05-18 VITALS — BP 136/79 | HR 69

## 2018-05-18 DIAGNOSIS — M779 Enthesopathy, unspecified: Secondary | ICD-10-CM

## 2018-05-18 DIAGNOSIS — M79672 Pain in left foot: Secondary | ICD-10-CM

## 2018-05-18 NOTE — Progress Notes (Signed)
Subjective:   Patient ID: Rebecca Huang, female   DOB: 72 y.o.   MRN: [id number]   HPI Patient presents stating the lateral side of my left foot I get sharp pain that comes and goes and it seems to have gotten better recently but I wanted checked.  Patient does not smoke likes to be active   Review of Systems  All other systems reviewed and are negative.       Objective:  Physical Exam  Constitutional: She appears well-developed and well-nourished.  Cardiovascular: Intact distal pulses.  Pulmonary/Chest: Effort normal.  Musculoskeletal: Normal range of motion.  Neurological: She is alert.  Skin: Skin is warm.  Nursing note and vitals reviewed.   Neurovascular status intact muscle strength is adequate range of motion within normal limits with patient found to have mild discomfort around the fourth MPJ left with a click of the third interspace left with no digital abnormalities or indications of displacement.  Patient has good digital perfusion well oriented x3     Assessment:  Possibility for capsulitis fourth MPJ left versus possibility for neuroma symptoms or stress fracture     Plan:  X-rays were reviewed with patient and at this point I have recommended no treatment with rigid bottom shoes and if symptoms were to come back will be seen immediately.  X-ray was negative for signs of fracture or indications of arthritis of the joint surface

## 2018-05-23 DIAGNOSIS — M542 Cervicalgia: Secondary | ICD-10-CM | POA: Diagnosis not present

## 2018-05-23 DIAGNOSIS — I10 Essential (primary) hypertension: Secondary | ICD-10-CM | POA: Diagnosis not present

## 2018-05-23 DIAGNOSIS — M4120 Other idiopathic scoliosis, site unspecified: Secondary | ICD-10-CM | POA: Diagnosis not present

## 2018-05-23 DIAGNOSIS — G5603 Carpal tunnel syndrome, bilateral upper limbs: Secondary | ICD-10-CM | POA: Diagnosis not present

## 2018-05-23 DIAGNOSIS — M5412 Radiculopathy, cervical region: Secondary | ICD-10-CM | POA: Diagnosis not present

## 2018-05-23 DIAGNOSIS — Z681 Body mass index (BMI) 19 or less, adult: Secondary | ICD-10-CM | POA: Diagnosis not present

## 2018-06-09 DIAGNOSIS — H26491 Other secondary cataract, right eye: Secondary | ICD-10-CM | POA: Diagnosis not present

## 2018-06-14 ENCOUNTER — Encounter: Payer: Self-pay | Admitting: Physical Medicine & Rehabilitation

## 2018-06-23 DIAGNOSIS — H26492 Other secondary cataract, left eye: Secondary | ICD-10-CM | POA: Diagnosis not present

## 2018-07-04 DIAGNOSIS — G5603 Carpal tunnel syndrome, bilateral upper limbs: Secondary | ICD-10-CM | POA: Diagnosis not present

## 2018-07-04 DIAGNOSIS — M4802 Spinal stenosis, cervical region: Secondary | ICD-10-CM | POA: Diagnosis not present

## 2018-07-08 ENCOUNTER — Ambulatory Visit: Payer: PRIVATE HEALTH INSURANCE | Admitting: Physical Medicine & Rehabilitation

## 2018-07-08 ENCOUNTER — Encounter

## 2018-07-14 DIAGNOSIS — Z23 Encounter for immunization: Secondary | ICD-10-CM | POA: Diagnosis not present

## 2018-07-20 DIAGNOSIS — M4802 Spinal stenosis, cervical region: Secondary | ICD-10-CM | POA: Diagnosis not present

## 2018-07-20 DIAGNOSIS — M5412 Radiculopathy, cervical region: Secondary | ICD-10-CM | POA: Diagnosis not present

## 2018-07-20 DIAGNOSIS — G5603 Carpal tunnel syndrome, bilateral upper limbs: Secondary | ICD-10-CM | POA: Diagnosis not present

## 2018-07-20 DIAGNOSIS — M4120 Other idiopathic scoliosis, site unspecified: Secondary | ICD-10-CM | POA: Diagnosis not present

## 2018-07-20 DIAGNOSIS — I10 Essential (primary) hypertension: Secondary | ICD-10-CM | POA: Diagnosis not present

## 2018-07-20 DIAGNOSIS — M542 Cervicalgia: Secondary | ICD-10-CM | POA: Diagnosis not present

## 2018-08-05 DIAGNOSIS — M5412 Radiculopathy, cervical region: Secondary | ICD-10-CM | POA: Diagnosis not present

## 2018-08-05 DIAGNOSIS — M5481 Occipital neuralgia: Secondary | ICD-10-CM | POA: Diagnosis not present

## 2018-08-09 ENCOUNTER — Telehealth: Payer: Self-pay | Admitting: Neurology

## 2018-08-09 NOTE — Telephone Encounter (Signed)
I called pt. I advised her that I would check with Dr. Rexene Alberts when she returns to the office if pt can keep her botox appt on 08/25/18. I will call pt back before her appt. Pt verbalized understanding.

## 2018-08-09 NOTE — Telephone Encounter (Signed)
Patient is scheduled for Botox with Dr. Rexene Alberts on 08-25-18. She had a nerve block last Friday in her neck and will have carpal tunnel surgery on 08-23-18. Is it alright for her to have Botox? I advised Dr. Rexene Alberts is out of the office and will send message to her nurse.

## 2018-08-10 ENCOUNTER — Ambulatory Visit: Payer: Medicare Other | Admitting: Neurology

## 2018-08-22 NOTE — Telephone Encounter (Signed)
I spoke with Dr. Rexene Alberts regarding pt's botox following her carpal tunnel surgery and her cervical nerve block. Dr. Rexene Alberts reports that as long as pt does not have general anesthesia for her carpal tunnel surgery tomorrow the the botox appt on 08/25/18 is fine. I called pt and explained this to her. She does not anticipate that general anesthesia will be used but will let us know if this is the case.

## 2018-08-23 DIAGNOSIS — G5603 Carpal tunnel syndrome, bilateral upper limbs: Secondary | ICD-10-CM | POA: Diagnosis not present

## 2018-08-23 DIAGNOSIS — G5602 Carpal tunnel syndrome, left upper limb: Secondary | ICD-10-CM | POA: Diagnosis not present

## 2018-08-25 ENCOUNTER — Encounter: Payer: Self-pay | Admitting: Neurology

## 2018-08-25 ENCOUNTER — Telehealth: Payer: Self-pay | Admitting: Neurology

## 2018-08-25 ENCOUNTER — Ambulatory Visit (INDEPENDENT_AMBULATORY_CARE_PROVIDER_SITE_OTHER): Payer: Medicare Other | Admitting: Neurology

## 2018-08-25 VITALS — BP 133/84 | HR 69 | Ht 64.0 in | Wt 111.0 lb

## 2018-08-25 DIAGNOSIS — G43019 Migraine without aura, intractable, without status migrainosus: Secondary | ICD-10-CM

## 2018-08-25 MED ORDER — ONABOTULINUMTOXINA 100 UNITS IJ SOLR
200.0000 [IU] | Freq: Once | INTRAMUSCULAR | Status: AC
  Administered 2018-08-25: 200 [IU] via INTRAMUSCULAR

## 2018-08-25 NOTE — Telephone Encounter (Signed)
3 mos btx °

## 2018-08-25 NOTE — Patient Instructions (Signed)
Please remember, botulinum toxin takes about 3-7 days to kick in. As discussed, this is not a pain shot. The purpose of the injections is to gradually improve your symptoms. In some patients it takes up to 2-3 weeks to make a difference and it wears off with time. Sometimes it may wear off before it is time for the next injection. We still should wait till the next 3 monthly injection, because injecting too frequently may cause you to develop immunity to the botulinum toxin. We are looking for a reduction in the severity and/or frequency of your symptoms. As a reminder, side effects to look out for are (but not limited to): mouth dryness, dryness of the eyes, heaviness of your head or muscle weakness, including droopy face or droopy eyelid(s), rarely: speech or swallowing difficulties and very rarely: breathing difficulties. Some people have transient neck pain or soreness which typically responds to over-the-counter anti-inflammatory medication and local heat application with a heat pad. If you think you have a severe reaction to the botulinum toxin, such as weakness, trouble speaking, trouble breathing, or trouble swallowing, you have to call 911 or have someone take you to the nearest emergency room. However, most people have either no or minimal side effects from the injections. It is normal to have a little bit of redness and swelling around the injection sites which usually improves after a few hours. Rarely, there may be a bruise that improves on its own. Most side effects reported are very mild and resolve within 10-14 days. Please feel free to call us if you have any additional questions or concerns: [phone] or email us through My Chart. We may have to adjust the dose over time, depending on your results from this injection and your overall response over time to this medication.   

## 2018-08-25 NOTE — Progress Notes (Signed)
Rebecca Huang is a 72 year old right-handed woman with an underlying medical history of kidney disease, irritable bowel syndrome, scoliosis, low back pain, b/l CTS, allergic rhinitis, underweight state and chronic dry eyes, who presents for her third round of Botox injection for her intractable migraine headaches. The patient is unaccompanied today. I last saw her on 05/05/2018 at which time she received her second Botox injection and reported doing okay. She had a flareup in her migraine headache recently and took extra strength Excedrin for this.  I talked to the patient in detail about expectations, limitations, benefits as well as potential adverse effects of botulinum toxin injections. The patient understands that the side effects include (but are not limited to): Mouth dryness, dryness of eyes, speech and swallowing difficulties, respiratory depression or problems breathing, weakness of muscles including more distant muscles than the ones injected, flu-like symptoms, myalgias, injection site reactions such as redness, itching, swelling, pain, and infection.   200 units of botulinum toxin type A were reconstituted using preservative-free normal saline to a concentration of 10 units per 0.1 mL and drawn up into 1 mL tuberculin syringes. Buy and bill, 155 u used, 45 u disc as unavoidable waste. Lot numbers: [id number], exp dates: 01/2021   Today, 08/25/2018:   O/E: BP 133/84   Pulse 69   Ht '5\' 4"'$  (1.626 m)   Wt 111 lb (50.3 kg)   BMI 19.05 kg/m    She reports doing okay, has been using Zomig more frequently. Recent CTS surgery on the L.left wrist is bandaged. She denies any pain. She still has right occipital shooting pains, had a nerve block under Dr. Maryjean Ka which she believes has not helped very much.   The patient was situated in a chair, sitting comfortably. After preparing the areas with 70% isopropyl alcohol and using a 26 gauge 1 1/2 inch hollow lumen recording EMG needle for the neck  injections as well as a 30 gauge 1 inch needle for the facial injections, a total dose of 155 units of botulinum toxin type A in the form of Botox was injected into the muscles and the following distribution and quantities:   #1: 10 units on the right and 10 units in the left frontalis muscles, broken down in 2 sites on each side. #2: 5 units in the right and 5 units in the left corrugator muscles. #3: 15 units in the right and 15 units in the left occipitalis muscles, broken down in 3 sites on each side. #4: 20 units in the right and 20 units in the left temporalis muscles, broken down in 4 sites on each side.  #5: 15 units on the right and 15 units in the left upper trapezius muscles, broken down in 3 sites on each side. #6: 10 units in the right and 10 units in the left splenius capitis muscles, broken down in 2 sites on each side.  #7: 2.5 units in the right and 2.5 units in the left procerus muscles.   EMG guidance was utilized for the neck injections with mild EMG activity noted In the left more than right trap and splenius capitis areas.  A dose of 45 units out of a total dose of 200 units was discarded as unavoidable waste.   The patient tolerated the procedure well without immediate complications. She was advised to make a followup appointment for repeat injections in 3 months from now and encouraged to call us with any interim questions, concerns, problems, or updates. She was in agreement  and did not have any questions prior to leaving clinic today.   Previously (copied from previous notes for reference):  05/05/2018: (She) presents for her second round of botulinum toxin injections for her intractable migraine headaches. The patient is unaccompanied today.   I saw her on 01/31/2018 for initial botulinum toxin injections, at which time she received 155 units of botulinum toxin type A for migraines.   I first met her on 01/27/2018, at which time she reported a long-standing history of  migraine headaches since essentially childhood. She had been tried on multiple abortive and preventive medications over the years. She had finally had success with recurrent Botox injections which she had been receiving for the past 6 years or so, last injection in December 2018 before she moved to [place] from [place]. Prior to starting Botox injections, she had at least 15 headache days per month, in fact, nearly daily at one point.   01/27/2018: 72 year old right-handed woman with an underlying medical history of kidney disease, irritable bowel syndrome, scoliosis, low back pain, b/l CTS, allergic rhinitis, underweight state and chronic dry eyes, who reports a longstanding History of migraine headaches probably for childhood as she had headaches and significant nausea and some vomiting as she recalls when she was a child. She also has a family history of migraines including her mother, her sister, and her son. She has one daughter who is the older one and one son. She recently retired. She quit smoking in 1975. She lives with her husband of 73 years. She drinks alcohol rarely, likes hot tea in the morning and occasional Diet Coke. I reviewed your office note from 12/23/2017. She has previously received Botox injections for her migraine headaches. She has a long-standing history of migraine headaches and has tried and failed multiple preventative and abortive medications. As far as her preventative medications, she has tried Topamax, Depakote, propranolol, verapamil. as far as abortive medications, she has been on Imitrex in the past including injections and by mouth as well as Maxalt. Currently she is on Zomig. She has received botulinum toxin injections. [place] from [place] and [place] seeing Dr. Domingo Cocking at the headache clinic. His clinic does not accept her insurance for Botox injections any longer. She has tried trigger point injections and has not had much success. Last Botox injection was in  December.  Migraines were infrequent some decades ago, were up to more than 15 HA days per month before she started having the Botox injections some 6 years ago, while still in [place]. She moved with her husband to [place] end of Dec.  She has had what sounds like right sided occipital neuralgic pain, and extra strength excedrin When necessary is quite helpful for this. She has had trigger point injections repeatedly which have not been helpful. Her last Botox injection was in December 2018. She is worried about getting a flareup of her migraines because she is overdue for her Botox injections. This is completely different from her migraine, which is usually L sided and wo aura. She has had some stressors recently. Has lost weight in the last year. Husband recently had to have triple bypass, but thankfully has been doing well.

## 2018-08-29 NOTE — Telephone Encounter (Signed)
I called the patient to schedule but she did not answer so I left a VM asking her to call back.

## 2018-10-06 DIAGNOSIS — M50223 Other cervical disc displacement at C6-C7 level: Secondary | ICD-10-CM | POA: Diagnosis not present

## 2018-10-06 DIAGNOSIS — M4802 Spinal stenosis, cervical region: Secondary | ICD-10-CM | POA: Diagnosis not present

## 2018-10-06 DIAGNOSIS — M47812 Spondylosis without myelopathy or radiculopathy, cervical region: Secondary | ICD-10-CM | POA: Diagnosis not present

## 2018-10-10 DIAGNOSIS — M5412 Radiculopathy, cervical region: Secondary | ICD-10-CM | POA: Diagnosis not present

## 2018-10-10 DIAGNOSIS — I10 Essential (primary) hypertension: Secondary | ICD-10-CM | POA: Diagnosis not present

## 2018-10-10 DIAGNOSIS — G5603 Carpal tunnel syndrome, bilateral upper limbs: Secondary | ICD-10-CM | POA: Diagnosis not present

## 2018-10-10 DIAGNOSIS — M4120 Other idiopathic scoliosis, site unspecified: Secondary | ICD-10-CM | POA: Diagnosis not present

## 2018-10-10 DIAGNOSIS — M542 Cervicalgia: Secondary | ICD-10-CM | POA: Diagnosis not present

## 2018-10-10 DIAGNOSIS — M4802 Spinal stenosis, cervical region: Secondary | ICD-10-CM | POA: Diagnosis not present

## 2018-10-13 DIAGNOSIS — Z0000 Encounter for general adult medical examination without abnormal findings: Secondary | ICD-10-CM | POA: Diagnosis not present

## 2018-10-13 DIAGNOSIS — E559 Vitamin D deficiency, unspecified: Secondary | ICD-10-CM | POA: Diagnosis not present

## 2018-10-13 DIAGNOSIS — Z79899 Other long term (current) drug therapy: Secondary | ICD-10-CM | POA: Diagnosis not present

## 2018-10-13 DIAGNOSIS — R634 Abnormal weight loss: Secondary | ICD-10-CM | POA: Diagnosis not present

## 2018-10-13 DIAGNOSIS — G43909 Migraine, unspecified, not intractable, without status migrainosus: Secondary | ICD-10-CM | POA: Diagnosis not present

## 2018-10-13 DIAGNOSIS — Z1389 Encounter for screening for other disorder: Secondary | ICD-10-CM | POA: Diagnosis not present

## 2018-10-13 DIAGNOSIS — S2239XA Fracture of one rib, unspecified side, initial encounter for closed fracture: Secondary | ICD-10-CM | POA: Diagnosis not present

## 2018-10-13 DIAGNOSIS — Z13220 Encounter for screening for lipoid disorders: Secondary | ICD-10-CM | POA: Diagnosis not present

## 2018-11-08 DIAGNOSIS — G5601 Carpal tunnel syndrome, right upper limb: Secondary | ICD-10-CM | POA: Diagnosis not present

## 2018-11-28 ENCOUNTER — Telehealth: Payer: Self-pay | Admitting: Neurology

## 2018-11-28 ENCOUNTER — Ambulatory Visit (INDEPENDENT_AMBULATORY_CARE_PROVIDER_SITE_OTHER): Payer: Medicare Other | Admitting: Neurology

## 2018-11-28 ENCOUNTER — Encounter: Payer: Self-pay | Admitting: Neurology

## 2018-11-28 VITALS — BP 140/88 | HR 86 | Ht 64.5 in | Wt 112.0 lb

## 2018-11-28 DIAGNOSIS — G43019 Migraine without aura, intractable, without status migrainosus: Secondary | ICD-10-CM

## 2018-11-28 MED ORDER — ZOLMITRIPTAN 5 MG PO TABS: 5.0000 mg | ORAL_TABLET | ORAL | 5 refills | Status: DC | PRN

## 2018-11-28 MED ORDER — ZOLMITRIPTAN 5 MG PO TBDP: 5.0000 mg | ORAL_TABLET | ORAL | 5 refills | Status: DC | PRN

## 2018-11-28 MED ORDER — ONABOTULINUMTOXINA 100 UNITS IJ SOLR
200.0000 [IU] | Freq: Once | INTRAMUSCULAR | Status: AC
  Administered 2018-11-28: 200 [IU] via INTRAMUSCULAR

## 2018-11-28 NOTE — Patient Instructions (Signed)
Please remember, botulinum toxin takes about 3-7 days to kick in. As discussed, this is not a pain shot. The purpose of the injections is to gradually improve your symptoms. In some patients it takes up to 2-3 weeks to make a difference and it wears off with time. Sometimes it may wear off before it is time for the next injection. We still should wait till the next 3 monthly injection, because injecting too frequently may cause you to develop immunity to the botulinum toxin. We are looking for a reduction in the severity and/or frequency of your symptoms. As a reminder, side effects to look out for are (but not limited to): mouth dryness, dryness of the eyes, heaviness of your head or muscle weakness, including droopy face or droopy eyelid(s), rarely: speech or swallowing difficulties and very rarely: breathing difficulties. Some people have transient neck pain or soreness which typically responds to over-the-counter anti-inflammatory medication and local heat application with a heat pad. If you think you have a severe reaction to the botulinum toxin, such as weakness, trouble speaking, trouble breathing, or trouble swallowing, you have to call 911 or have someone take you to the nearest emergency room. However, most people have either no or minimal side effects from the injections. It is normal to have a little bit of redness and swelling around the injection sites which usually improves after a few hours. Rarely, there may be a bruise that improves on its own. Most side effects reported are very mild and resolve within 10-14 days. Please feel free to call us if you have any additional questions or concerns: [phone] or email us through My Chart. We may have to adjust the dose over time, depending on your results from this injection and your overall response over time to this medication.   

## 2018-11-28 NOTE — Progress Notes (Signed)
Rebecca Huang is a 73 year old right-handed woman with an underlying medical history of kidney disease, irritable bowel syndrome, scoliosis, low back pain, b/l CTS, allergic rhinitis, underweight state and chronic dry eyes, who presents for her fourth round of Botox injections for her intractable migraine headaches. The patient is unaccompanied today. I last saw her on 08/25/2018 at which time she received her third round of Botox injections. She had recently received an occipital nerve block under Dr. Maryjean Ka. She was also status post recent carpal tunnel surgery at the time.  I talked to the patient in detail about expectations, limitations, benefits as well as potential adverse effects of botulinum toxin injections. The patient understands that the side effects include (but are not limited to): Mouth dryness, dryness of eyes, speech and swallowing difficulties, respiratory depression or problems breathing, weakness of muscles including more distant muscles than the ones injected, flu-like symptoms, myalgias, injection site reactions such as redness, itching, swelling, pain, and infection.   200 units of botulinum toxin type A were reconstituted using preservative-free normal saline to a concentration of 10 units per 0.1 mL and drawn up into 1 mL tuberculin syringes. Buy and bill, 155 u used, 45 u disc as unavoidable waste. Lot numbers: [id number], exp dates: 03/2021.    Today, 11/28/2018: she reports that the Botox injections and been helpful for her migraine headaches. Nevertheless, she continues to suffer from intermittent severe neck pain which starts in the right side in the back and radiates upwards and incised throbbing. It is different from her typical migraines but seems to still responses Zomig if anything. She did not respond favorably to recent injections under Dr. Maryjean Ka she believes. She only had one set of injections. She had carpal tunnel surgery in the interim. She has a follow-up appointment  with Dr. Vertell Limber for this. She had a recommendation for her doctor at [facility] from her previous headache specialist in [place]. She will try to find out who he recommended, I advised her that we can certainly make a referral to [facility] if needed. She had no side effects from the last Botox injections. She is requesting a refill on the Zomig.    O/E: BP 140/88   Pulse 86   Ht 5' 4.5" (1.638 m)   Wt 112 lb (50.8 kg)   BMI 18.93 kg/m   The patient was situated in a chair, sitting comfortably. After preparing the areas with 70% isopropyl alcohol and using a 26 gauge 1 1/2 inch hollow lumen recording EMG needle for the neck injections as well as a 30 gauge 1 inch needle for the facial injections, a total dose of 155 units of botulinum toxin type A in the form of Botox was injected into the muscles and the following distribution and quantities:   #1: 10 units on the right and 10 units in the left frontalis muscles, broken down in 2 sites on each side. #2: 5 units in the right and 5 units in the left corrugator muscles. #3: 15 units in the right and 15 units in the left occipitalis muscles, broken down in 3 sites on each side. #4: 20 units in the right and 20 units in the left temporalis muscles, broken down in 4 sites on each side.  #5: 15 units on the right and 15 units in the left upper trapezius muscles, broken down in 3 sites on each side. #6: 10 units in the right and 10 units in the left splenius capitis muscles, broken down in 2 sites  on each side.  #7: 2.5 units in the right and 2.5 units in the left procerus muscles.   EMG guidance was utilized for the neck injections with mild EMG activity noted In the right neck muscles, they seem tight today.   A dose of 45 units out of a total dose of 200 units was discarded as unavoidable waste.   The patient tolerated the procedure well without immediate complications. She was advised to make a followup appointment for repeat injections in 3 months from now and  encouraged to call us with any interim questions, concerns, problems, or updates. She was in agreement and did not have any questions prior to leaving clinic today.   Previously (copied from previous notes for reference):  05/05/2018: (She) presents for her second round of botulinum toxin injections for her intractable migraine headaches. The patient is unaccompanied today.    I saw her on 01/31/2018 for initial botulinum toxin injections, at which time she received 155 units of botulinum toxin type A for migraines.   I first met her on 01/27/2018, at which time she reported a long-standing history of migraine headaches since essentially childhood. She had been tried on multiple abortive and preventive medications over the years. She had finally had success with recurrent Botox injections which she had been receiving for the past 6 years or so, last injection in December 2018 before she moved to [place] from [place]. Prior to starting Botox injections, she had at least 15 headache days per month, in fact, nearly daily at one point.   01/27/2018: 73 year old right-handed woman with an underlying medical history of kidney disease, irritable bowel syndrome, scoliosis, low back pain, b/l CTS, allergic rhinitis, underweight state and chronic dry eyes, who reports a longstanding History of migraine headaches probably for childhood as she had headaches and significant nausea and some vomiting as she recalls when she was a child. She also has a family history of migraines including her mother, her sister, and her son. She has one daughter who is the older one and one son. She recently retired. She quit smoking in 1975. She lives with her husband of 66 years. She drinks alcohol rarely, likes hot tea in the morning and occasional Diet Coke. I reviewed your office note from 12/23/2017. She has previously received Botox injections for her migraine headaches. She has a long-standing history of migraine headaches  and has tried and failed multiple preventative and abortive medications. As far as her preventative medications, she has tried Topamax, Depakote, propranolol, verapamil. as far as abortive medications, she has been on Imitrex in the past including injections and by mouth as well as Maxalt. Currently she is on Zomig. She has received botulinum toxin injections. She moved from out of state and started seeing Dr. Domingo Cocking at the headache clinic. His clinic does not accept her insurance for Botox injections any longer. She has tried trigger point injections and has not had much success. Last Botox injection was in December.  Migraines were infrequent some decades ago, were up to more than 15 HA days per month before she started having the Botox injections some 6 years ago, while still in [place]. She moved with her husband to [place] end of Dec.  She has had what sounds like right sided occipital neuralgic pain, and extra strength excedrin When necessary is quite helpful for this. She has had trigger point injections repeatedly which have not been helpful. Her last Botox injection was in December 2018. She is worried about getting  a flareup of her migraines because she is overdue for her Botox injections. This is completely different from her migraine, which is usually L sided and wo aura. She has had some stressors recently. Has lost weight in the last year. Husband recently had to have triple bypass, but thankfully has been doing well.

## 2018-11-28 NOTE — Telephone Encounter (Signed)
Pt states she is needing her zolmitriptan (ZOMIG) 5 MG tablet in the disintegrating form. Please send new RX to the [facility] in [place]

## 2018-11-28 NOTE — Telephone Encounter (Signed)
Received VO from Dr. Rexene Alberts to change zomig to disintegrating tablet. I verified with pt that she is using Kristopher Oppenheim at [facility] on [address]. RX has been sent. Pt verbalized understanding.

## 2018-11-29 ENCOUNTER — Encounter: Admit: 2018-11-29 | Discharge: 2018-11-29 | Payer: BC Managed Care – PPO

## 2018-11-29 NOTE — Telephone Encounter
11/28/18. I called Diane Caldwell to follow up why she would not be coming to her treatment 11/28/18. She [place] that [place] I educated Diane Caldwell on the importance of her radiation treatments. Diane Caldwell said she would be in 11/29/18 to continue her radiation treatments. EAlvey.

## 2018-12-19 DIAGNOSIS — M47812 Spondylosis without myelopathy or radiculopathy, cervical region: Secondary | ICD-10-CM | POA: Diagnosis not present

## 2019-01-09 DIAGNOSIS — R030 Elevated blood-pressure reading, without diagnosis of hypertension: Secondary | ICD-10-CM | POA: Diagnosis not present

## 2019-01-09 DIAGNOSIS — M5481 Occipital neuralgia: Secondary | ICD-10-CM | POA: Diagnosis not present

## 2019-01-23 DIAGNOSIS — M4120 Other idiopathic scoliosis, site unspecified: Secondary | ICD-10-CM | POA: Diagnosis not present

## 2019-01-23 DIAGNOSIS — M5481 Occipital neuralgia: Secondary | ICD-10-CM | POA: Diagnosis not present

## 2019-01-23 DIAGNOSIS — M47812 Spondylosis without myelopathy or radiculopathy, cervical region: Secondary | ICD-10-CM | POA: Diagnosis not present

## 2019-01-23 DIAGNOSIS — M542 Cervicalgia: Secondary | ICD-10-CM | POA: Diagnosis not present

## 2019-01-23 DIAGNOSIS — M5412 Radiculopathy, cervical region: Secondary | ICD-10-CM | POA: Diagnosis not present

## 2019-02-06 DIAGNOSIS — M5481 Occipital neuralgia: Secondary | ICD-10-CM | POA: Diagnosis not present

## 2019-02-06 DIAGNOSIS — M5412 Radiculopathy, cervical region: Secondary | ICD-10-CM | POA: Diagnosis not present

## 2019-02-06 DIAGNOSIS — M542 Cervicalgia: Secondary | ICD-10-CM | POA: Diagnosis not present

## 2019-03-21 DIAGNOSIS — M5412 Radiculopathy, cervical region: Secondary | ICD-10-CM | POA: Diagnosis not present

## 2019-04-04 DIAGNOSIS — Z961 Presence of intraocular lens: Secondary | ICD-10-CM | POA: Diagnosis not present

## 2019-04-04 DIAGNOSIS — H524 Presbyopia: Secondary | ICD-10-CM | POA: Diagnosis not present

## 2019-04-04 DIAGNOSIS — H43813 Vitreous degeneration, bilateral: Secondary | ICD-10-CM | POA: Diagnosis not present

## 2019-04-04 DIAGNOSIS — H04123 Dry eye syndrome of bilateral lacrimal glands: Secondary | ICD-10-CM | POA: Diagnosis not present

## 2019-04-05 ENCOUNTER — Ambulatory Visit (INDEPENDENT_AMBULATORY_CARE_PROVIDER_SITE_OTHER): Payer: Medicare Other | Admitting: Neurology

## 2019-04-05 ENCOUNTER — Other Ambulatory Visit: Payer: Self-pay

## 2019-04-05 ENCOUNTER — Encounter: Payer: Self-pay | Admitting: Neurology

## 2019-04-05 ENCOUNTER — Telehealth: Payer: Self-pay

## 2019-04-05 ENCOUNTER — Telehealth: Payer: Self-pay | Admitting: Neurology

## 2019-04-05 VITALS — BP 141/99 | HR 79 | Temp 97.7°F | Ht 64.0 in | Wt 108.0 lb

## 2019-04-05 DIAGNOSIS — G43019 Migraine without aura, intractable, without status migrainosus: Secondary | ICD-10-CM | POA: Diagnosis not present

## 2019-04-05 MED ORDER — UBROGEPANT 50 MG PO TABS: 50.0000 mg | ORAL_TABLET | ORAL | 3 refills | Status: DC | PRN

## 2019-04-05 MED ORDER — ONABOTULINUMTOXINA 100 UNITS IJ SOLR
100.0000 [IU] | Freq: Once | INTRAMUSCULAR | Status: AC
  Administered 2019-04-05: 100 [IU] via INTRAMUSCULAR

## 2019-04-05 NOTE — Telephone Encounter (Signed)
PA for ubrelvy completed via covermymeds. Key: [id number]  Should have a determination in 3-5 business days.

## 2019-04-05 NOTE — Patient Instructions (Signed)
Please remember, botulinum toxin takes about 3-7 days to kick in. As discussed, this is not a pain shot. The purpose of the injections is to gradually improve your symptoms. In some patients it takes up to 2-3 weeks to make a difference and it wears off with time. Sometimes it may wear off before it is time for the next injection. We still should wait till the next 3 monthly injection, because injecting too frequently may cause you to develop immunity to the botulinum toxin. We are looking for a reduction in the severity and/or frequency of your symptoms. As a reminder, side effects to look out for are (but not limited to): mouth dryness, dryness of the eyes, heaviness of your head or muscle weakness, including droopy face or droopy eyelid(s), rarely: speech or swallowing difficulties and very rarely: breathing difficulties. Some people have transient neck pain or soreness which typically responds to over-the-counter anti-inflammatory medication and local heat application with a heat pad. If you think you have a severe reaction to the botulinum toxin, such as weakness, trouble speaking, trouble breathing, or trouble swallowing, you have to call 911 or have someone take you to the nearest emergency room. However, most people have either no or minimal side effects from the injections. It is normal to have a little bit of redness and swelling around the injection sites which usually improves after a few hours. Rarely, there may be a bruise that improves on its own. Most side effects reported are very mild and resolve within 10-14 days. Please feel free to call us if you have any additional questions or concerns: [phone] or email us through My Chart. We may have to adjust the dose over time, depending on your results from this injection and your overall response over time to this medication.   

## 2019-04-05 NOTE — Telephone Encounter (Signed)
Her apt's have already been made for the year. DW

## 2019-04-05 NOTE — Progress Notes (Signed)
Rebecca Huang is a 73 year old right-handed woman with an underlying medical history of kidney disease, irritable bowel syndrome, scoliosis, low back pain, b/l CTS, allergic rhinitis, underweight state and chronic dry eyes, who presents for her fifth round of Botox injections for her intractable migraine headaches. The patient is unaccompanied today. I last saw her on 11/28/2018, at which time she reported that Botox injections were still helpful and she had no side effects.  She still had neck pain.  She had received a neck injection.  She had recent carpal tunnel surgery.  Her previous headache specialist had recommended somebody at [facility], she was advised to find out who her doctor had recommended and I suggested I could make a referral to the specialist if she desired.   I talked to the patient in detail about expectations, limitations, benefits as well as potential adverse effects of botulinum toxin injections. The patient understands that the side effects include (but are not limited to): Mouth dryness, dryness of eyes, speech and swallowing difficulties, respiratory depression or problems breathing, weakness of muscles including more distant muscles than the ones injected, flu-like symptoms, myalgias, injection site reactions such as redness, itching, swelling, pain, and infection.   200 units of botulinum toxin type A were reconstituted using preservative-free normal saline to a concentration of 10 units per 0.1 mL and drawn up into 1 mL tuberculin syringes. Buy and bill, 155 u used, 45 u disc as unavoidable waste. Lot numbers: [id number], and [id number] for each 100 u vial, exp dates: 11/2021 for both vials.   Today, 04/05/2019: she reports that the Botox injections and been helpful for her migraine headaches. However, She is overdue by over 4 weeks since her last injection and she has noted in the past 2 or 3 weeks a significant increase in her migraines, nearly daily.She had interim repeat injection into her  neck her kidneys but it has not been very effective. She had no side effects from the last Botox injections.  We talked about utilizing 1 of the new, "gepant" Medications by mouth for as needed use.  She would be willing to try Ubrelvy.   O/E: BP (!) 141/99   Pulse 79   Temp 97.7 F (36.5 C)   Ht '5\' 4"'  (1.626 m)   Wt 108 lb (49 kg)   BMI 18.54 kg/m   The patient was situated in a chair, sitting comfortably. After preparing the areas with 70% isopropyl alcohol and using a 30 gauge 1 inch needle, a total dose of 155 units of botulinum toxin type A in the form of Botox was injected into the muscles and the following distribution and quantities:   #1: 10 units on the right and 10 units in the left frontalis muscles, broken down in 2 sites on each side. #2: 5 units in the right and 5 units in the left corrugator muscles. #3: 15 units in the right and 15 units in the left occipitalis muscles, broken down in 3 sites on each side. #4: 20 units in the right and 20 units in the left temporalis muscles, broken down in 4 sites on each side.  #5: 15 units on the right and 15 units in the left upper trapezius muscles, broken down in 3 sites on each side. #6: 10 units in the right and 10 units in the left splenius capitis muscles, broken down in 2 sites on each side.  #7: 2.5 units in the right and 2.5 units in the left procerus muscles.  A dose of 45 units out of a total dose of 200 units was discarded as unavoidable waste.   The patient tolerated the procedure well without immediate complications. She was advised to make a followup appointment for repeat injections in 3 months from now and encouraged to call us with any interim questions, concerns, problems, or updates. She was in agreement and did not have any questions prior to leaving clinic today.   Previously (copied from previous notes for reference):   I saw her on 08/25/2018 at which time she received her third round of Botox injections. She  had recently received an occipital nerve block under Dr. Maryjean Ka. She was also status post recent carpal tunnel surgery at the time.  05/05/2018: (She) presents for her second round of botulinum toxin injections for her intractable migraine headaches. The patient is unaccompanied today.    I saw her on 01/31/2018 for initial botulinum toxin injections, at which time she received 155 units of botulinum toxin type A for migraines.   I first met her on 01/27/2018, at which time she reported a long-standing history of migraine headaches since essentially childhood. She had been tried on multiple abortive and preventive medications over the years. She had finally had success with recurrent Botox injections which she had been receiving for the past 6 years or so, last injection in December 2018 before she moved to [place] from [place]. Prior to starting Botox injections, she had at least 15 headache days per month, in fact, nearly daily at one point.   01/27/2018: 73 year old right-handed woman with an underlying medical history of kidney disease, irritable bowel syndrome, scoliosis, low back pain, b/l CTS, allergic rhinitis, underweight state and chronic dry eyes, who reports a longstanding History of migraine headaches probably for childhood as she had headaches and significant nausea and some vomiting as she recalls when she was a child. She also has a family history of migraines including her mother, her sister, and her son. She has one daughter who is the older one and one son. She recently retired. She quit smoking in 1975. She lives with her husband of 35 years. She drinks alcohol rarely, likes hot tea in the morning and occasional Diet Coke. I reviewed your office note from 12/23/2017. She has previously received Botox injections for her migraine headaches. She has a long-standing history of migraine headaches and has tried and failed multiple preventative and abortive medications. As far as her  preventative medications, she has tried Topamax, Depakote, propranolol, verapamil. as far as abortive medications, she has been on Imitrex in the past including injections and by mouth as well as Maxalt. Currently she is on Zomig. She has received botulinum toxin injections. She [place] from [place] of [place] and started seeing Dr. Domingo Cocking at the headache clinic. His clinic does not accept her insurance for Botox injections any longer. She has tried trigger point injections and has not had much success. Last Botox injection was in December.  Migraines were infrequent some decades ago, were up to more than 15 HA days per month before she started having the Botox injections some 6 years ago, while still in [place]. She moved with her husband to [place] end of Dec.  She has had what sounds like right sided occipital neuralgic pain, and extra strength excedrin When necessary is quite helpful for this. She has had trigger point injections repeatedly which have not been helpful. Her last Botox injection was in December 2018. She is worried about getting a flareup of  her migraines because she is overdue for her Botox injections. This is completely different from her migraine, which is usually L sided and wo aura. She has had some stressors recently. Has lost weight in the last year. Husband recently had to have triple bypass, but thankfully has been doing well.

## 2019-04-05 NOTE — Telephone Encounter (Signed)
3 mo btx °

## 2019-04-10 NOTE — Telephone Encounter (Signed)
PA for Rebecca Huang was approved by [facility] from 04/05/2019 until further notice. Ticket # [id number].  Copy sent to [facility].  I called pt, advised her of this, asked her to call Kristopher Oppenheim to discuss the price of Ubrelvy with insurance approval. Pt verbalized understanding.

## 2019-04-24 DIAGNOSIS — M5412 Radiculopathy, cervical region: Secondary | ICD-10-CM | POA: Diagnosis not present

## 2019-04-24 DIAGNOSIS — I10 Essential (primary) hypertension: Secondary | ICD-10-CM | POA: Diagnosis not present

## 2019-04-24 DIAGNOSIS — M47812 Spondylosis without myelopathy or radiculopathy, cervical region: Secondary | ICD-10-CM | POA: Diagnosis not present

## 2019-04-24 DIAGNOSIS — M542 Cervicalgia: Secondary | ICD-10-CM | POA: Diagnosis not present

## 2019-04-24 DIAGNOSIS — M4120 Other idiopathic scoliosis, site unspecified: Secondary | ICD-10-CM | POA: Diagnosis not present

## 2019-04-24 DIAGNOSIS — M5481 Occipital neuralgia: Secondary | ICD-10-CM | POA: Diagnosis not present

## 2019-05-04 DIAGNOSIS — H35423 Microcystoid degeneration of retina, bilateral: Secondary | ICD-10-CM | POA: Diagnosis not present

## 2019-05-04 DIAGNOSIS — H3540 Unspecified peripheral retinal degeneration: Secondary | ICD-10-CM | POA: Diagnosis not present

## 2019-05-04 DIAGNOSIS — H31091 Other chorioretinal scars, right eye: Secondary | ICD-10-CM | POA: Diagnosis not present

## 2019-05-04 DIAGNOSIS — H43813 Vitreous degeneration, bilateral: Secondary | ICD-10-CM | POA: Diagnosis not present

## 2019-06-07 DIAGNOSIS — H33311 Horseshoe tear of retina without detachment, right eye: Secondary | ICD-10-CM | POA: Diagnosis not present

## 2019-06-07 DIAGNOSIS — H43311 Vitreous membranes and strands, right eye: Secondary | ICD-10-CM | POA: Diagnosis not present

## 2019-06-07 DIAGNOSIS — H43391 Other vitreous opacities, right eye: Secondary | ICD-10-CM | POA: Diagnosis not present

## 2019-06-07 DIAGNOSIS — H43811 Vitreous degeneration, right eye: Secondary | ICD-10-CM | POA: Diagnosis not present

## 2019-06-08 DIAGNOSIS — H33311 Horseshoe tear of retina without detachment, right eye: Secondary | ICD-10-CM | POA: Diagnosis not present

## 2019-06-15 DIAGNOSIS — H31091 Other chorioretinal scars, right eye: Secondary | ICD-10-CM | POA: Diagnosis not present

## 2019-06-30 DIAGNOSIS — H6993 Unspecified Eustachian tube disorder, bilateral: Secondary | ICD-10-CM | POA: Diagnosis not present

## 2019-07-06 DIAGNOSIS — H33311 Horseshoe tear of retina without detachment, right eye: Secondary | ICD-10-CM | POA: Diagnosis not present

## 2019-07-11 ENCOUNTER — Other Ambulatory Visit: Payer: Self-pay

## 2019-07-11 ENCOUNTER — Ambulatory Visit (INDEPENDENT_AMBULATORY_CARE_PROVIDER_SITE_OTHER): Payer: Medicare Other | Admitting: Neurology

## 2019-07-11 ENCOUNTER — Encounter: Payer: Self-pay | Admitting: Neurology

## 2019-07-11 ENCOUNTER — Telehealth: Payer: Self-pay | Admitting: Neurology

## 2019-07-11 VITALS — BP 143/82 | HR 70 | Ht 64.5 in | Wt 109.0 lb

## 2019-07-11 DIAGNOSIS — G43019 Migraine without aura, intractable, without status migrainosus: Secondary | ICD-10-CM

## 2019-07-11 MED ORDER — ONABOTULINUMTOXINA 100 UNITS IJ SOLR
200.0000 [IU] | Freq: Once | INTRAMUSCULAR | Status: AC
  Administered 2019-07-11: 200 [IU] via INTRAMUSCULAR

## 2019-07-11 NOTE — Progress Notes (Signed)
Rebecca Huang is a 73 year old right-handed woman with an underlying medical history of kidney disease, irritable bowel syndrome, scoliosis, low back pain, b/l CTS, allergic rhinitis, underweight state and chronic dry eyes, who presents for Rebecca Huang sixth round of Botox injections for Rebecca Huang intractable migraine headaches. The patient is unaccompanied today. I last saw Rebecca Huang on 04/05/2019 for Rebecca Huang fifth round of injections.  Rebecca Huang had also undergone neck injections.  Rebecca Huang was willing to try Ubrelvy prn.   I talked to the patient in detail about expectations, limitations, benefits as well as potential adverse effects of botulinum toxin injections. The patient understands that the side effects include (but are not limited to): Mouth dryness, dryness of eyes, speech and swallowing difficulties, respiratory depression or problems breathing, weakness of muscles including more distant muscles than the ones injected, flu-like symptoms, myalgias, injection site reactions such as redness, itching, swelling, pain, and infection.   200 units of botulinum toxin type A were reconstituted using preservative-free normal saline to a concentration of 10 units per 0.1 mL and drawn up into 1 mL tuberculin syringes. Buy and bill, 155 u used, 45 u disc as unavoidable waste. Lot number: [id number], exp date: 02/2022 for one 200 u vial.   Today, 07/11/2019: Rebecca Huang reports Not being able to try the new medication, Ubrelvy, due to cost. Zomig is still helpful, Rebecca Huang does not typically have to take a second dose but has taken it at x2 days in a row.Rebecca Huang still has neck pain right more than left.  Rebecca Huang has shooting pain from the right base of the neck forward and upward.  Rebecca Huang has not been able to tolerate any oral medication. Sadly, Rebecca Huang lost Rebecca Huang mother suddenly at the age of 52 last month.  Rebecca Huang was in good health overall and passed away suddenly. Rebecca Huang reports that Rebecca Huang has good support with Rebecca Huang sister being close to Rebecca Huang.  Rebecca Huang is holding up okay.   O/E: BP (!)  143/82   Pulse 70   Ht 5' 4.5" (1.638 m)   Wt 109 lb (49.4 kg)   BMI 18.42 kg/m    The patient was situated in a chair, sitting comfortably. After preparing the areas with 70% isopropyl alcohol and using a 30 gauge 1 inch needle As well as a 26-gauge 1-1/2 inch hollow lumen recording needle for the neck injections, a total dose of 155 units of botulinum toxin type A in the form of Botox was injected into the muscles and the following distribution and quantities:   #1: 10 units on the right and 10 units in the left frontalis muscles, broken down in 2 sites on each side. #2: 5 units in the right and 5 units in the left corrugator muscles. #3: 15 units in the right and 15 units in the left occipitalis muscles, broken down in 3 sites on each side. #4: 20 units in the right and 20 units in the left temporalis muscles, broken down in 4 sites on each side.  #5: 15 units on the right and 15 units in the left upper trapezius muscles, broken down in 3 sites on each side. #6: 10 units in the right and 10 units in the left splenius capitis muscles, broken down in 2 sites on each side.  #7: 2.5 units in the right and 2.5 units in the left procerus muscles.   A dose of 45 units out of a total dose of 200 units was discarded as unavoidable waste.   EMGWas utilized for the  neck injections with minimal left-sided and mild right-sided EMG activity noted in the upper trapezius and splenius capitis muscles.   The patient tolerated the procedure well without immediate complications. Rebecca Huang was advised to make a followup appointment for repeat injections in 3 months from now and encouraged to call us with any interim questions, concerns, problems, or updates. Rebecca Huang was in agreement and did not have any questions prior to leaving clinic today.   Previously (copied from previous notes for reference):   I saw Rebecca Huang on 11/28/2018, at which time Rebecca Huang reported that Botox injections were still helpful and Rebecca Huang had no side effects.   Rebecca Huang still had neck pain.  Rebecca Huang had received a neck injection.  Rebecca Huang had recent carpal tunnel surgery.  Rebecca Huang previous headache specialist had recommended somebody at [facility], Rebecca Huang was advised to find out who Rebecca Huang doctor had recommended and I suggested I could make a referral to the specialist if Rebecca Huang desired.     I saw Rebecca Huang on 08/25/2018 at which time Rebecca Huang received Rebecca Huang third round of Botox injections. Rebecca Huang had recently received an occipital nerve block under Dr. Maryjean Ka. Rebecca Huang was also status post recent carpal tunnel surgery at the time.   05/05/2018: (Rebecca Huang) presents for Rebecca Huang second round of botulinum toxin injections for Rebecca Huang intractable migraine headaches. The patient is unaccompanied today.    I saw Rebecca Huang on 01/31/2018 for initial botulinum toxin injections, at which time Rebecca Huang received 155 units of botulinum toxin type A for migraines.   I first met Rebecca Huang on 01/27/2018, at which time Rebecca Huang reported a long-standing history of migraine headaches since essentially childhood. Rebecca Huang had been tried on multiple abortive and preventive medications over the years. Rebecca Huang had finally had success with recurrent Botox injections which Rebecca Huang had been receiving for the past 6 years or so, last injection in December 2018 before Rebecca Huang moved to [place] from [place]. Prior to starting Botox injections, Rebecca Huang had at least 15 headache days per month, in fact, nearly daily at one point.   01/27/2018: 73 year old right-handed woman with an underlying medical history of kidney disease, irritable bowel syndrome, scoliosis, low back pain, b/l CTS, allergic rhinitis, underweight state and chronic dry eyes, who reports a longstanding History of migraine headaches probably for childhood as Rebecca Huang had headaches and significant nausea and some vomiting as Rebecca Huang recalls when Rebecca Huang was a child. Rebecca Huang also has a family history of migraines including Rebecca Huang mother, Rebecca Huang sister, and Rebecca Huang son. Rebecca Huang has one daughter who is the older one and one son. Rebecca Huang recently retired. Rebecca Huang quit  smoking in 1975. Rebecca Huang lives with Rebecca Huang husband of 86 years. Rebecca Huang drinks alcohol rarely, likes hot tea in the morning and occasional Diet Coke. I reviewed your office note from 12/23/2017. Rebecca Huang has previously received Botox injections for Rebecca Huang migraine headaches. Rebecca Huang has a long-standing history of migraine headaches and has tried and failed multiple preventative and abortive medications. As far as Rebecca Huang preventative medications, Rebecca Huang has tried Topamax, Depakote, propranolol, verapamil. as far as abortive medications, Rebecca Huang has been on Imitrex in the past including injections and by mouth as well as Maxalt. Currently Rebecca Huang is on Zomig. Rebecca Huang has received botulinum toxin injections. [place] seeing Dr. Domingo Cocking at the headache clinic. His clinic does not accept Rebecca Huang insurance for Botox injections any longer. Rebecca Huang has tried trigger point injections and has not had much success. Last Botox injection was in December.  Migraines were infrequent some decades ago, were up to  more than 15 HA days per month before Rebecca Huang started having the Botox injections some 6 years ago, while still in [place]. Rebecca Huang moved with Rebecca Huang husband to [place] end of Dec.  Rebecca Huang has had what sounds like right sided occipital neuralgic pain, and extra strength excedrin When necessary is quite helpful for this. Rebecca Huang has had trigger point injections repeatedly which have not been helpful. Rebecca Huang last Botox injection was in December 2018. Rebecca Huang is worried about getting a flareup of Rebecca Huang migraines because Rebecca Huang is overdue for Rebecca Huang Botox injections. This is completely different from Rebecca Huang migraine, which is usually L sided and wo aura. Rebecca Huang has had some stressors recently. Has lost weight in the last year. Husband recently had to have triple bypass, but thankfully has been doing well.

## 2019-07-11 NOTE — Telephone Encounter (Signed)
error 

## 2019-07-11 NOTE — Patient Instructions (Signed)
Please remember, botulinum toxin takes about 3-7 days to kick in. As discussed, this is not a pain shot. The purpose of the injections is to gradually improve your symptoms. In some patients it takes up to 2-3 weeks to make a difference and it wears off with time. Sometimes it may wear off before it is time for the next injection. We still should wait till the next 3 monthly injection, because injecting too frequently may cause you to develop immunity to the botulinum toxin. We are looking for a reduction in the severity and/or frequency of your symptoms. As a reminder, side effects to look out for are (but not limited to): mouth dryness, dryness of the eyes, heaviness of your head or muscle weakness, including droopy face or droopy eyelid(s), rarely: speech or swallowing difficulties and very rarely: breathing difficulties. Some people have transient neck pain or soreness which typically responds to over-the-counter anti-inflammatory medication and local heat application with a heat pad. If you think you have a severe reaction to the botulinum toxin, such as weakness, trouble speaking, trouble breathing, or trouble swallowing, you have to call 911 or have someone take you to the nearest emergency room. However, most people have either no or minimal side effects from the injections. It is normal to have a little bit of redness and swelling around the injection sites which usually improves after a few hours. Rarely, there may be a bruise that improves on its own. Most side effects reported are very mild and resolve within 10-14 days. Please feel free to call us if you have any additional questions or concerns: [phone] or email us through My Chart. We may have to adjust the dose over time, depending on your results from this injection and your overall response over time to this medication.   

## 2019-07-13 DIAGNOSIS — Z23 Encounter for immunization: Secondary | ICD-10-CM | POA: Diagnosis not present

## 2019-08-03 DIAGNOSIS — H31091 Other chorioretinal scars, right eye: Secondary | ICD-10-CM | POA: Diagnosis not present

## 2019-08-10 DIAGNOSIS — Z23 Encounter for immunization: Secondary | ICD-10-CM | POA: Diagnosis not present

## 2019-08-15 ENCOUNTER — Other Ambulatory Visit: Payer: Self-pay | Admitting: Nurse Practitioner

## 2019-08-15 DIAGNOSIS — R6882 Decreased libido: Secondary | ICD-10-CM | POA: Diagnosis not present

## 2019-08-15 DIAGNOSIS — Z01419 Encounter for gynecological examination (general) (routine) without abnormal findings: Secondary | ICD-10-CM | POA: Diagnosis not present

## 2019-08-15 DIAGNOSIS — N9412 Deep dyspareunia: Secondary | ICD-10-CM | POA: Diagnosis not present

## 2019-08-15 DIAGNOSIS — R6889 Other general symptoms and signs: Secondary | ICD-10-CM | POA: Diagnosis not present

## 2019-08-15 DIAGNOSIS — Z1231 Encounter for screening mammogram for malignant neoplasm of breast: Secondary | ICD-10-CM

## 2019-08-15 DIAGNOSIS — Z8739 Personal history of other diseases of the musculoskeletal system and connective tissue: Secondary | ICD-10-CM | POA: Diagnosis not present

## 2019-08-21 DIAGNOSIS — L570 Actinic keratosis: Secondary | ICD-10-CM | POA: Diagnosis not present

## 2019-08-21 DIAGNOSIS — D2262 Melanocytic nevi of left upper limb, including shoulder: Secondary | ICD-10-CM | POA: Diagnosis not present

## 2019-08-21 DIAGNOSIS — C44719 Basal cell carcinoma of skin of left lower limb, including hip: Secondary | ICD-10-CM | POA: Diagnosis not present

## 2019-08-23 DIAGNOSIS — H43391 Other vitreous opacities, right eye: Secondary | ICD-10-CM | POA: Diagnosis not present

## 2019-08-23 DIAGNOSIS — H43311 Vitreous membranes and strands, right eye: Secondary | ICD-10-CM | POA: Diagnosis not present

## 2019-08-30 DIAGNOSIS — G509 Disorder of trigeminal nerve, unspecified: Secondary | ICD-10-CM | POA: Diagnosis not present

## 2019-08-30 DIAGNOSIS — R030 Elevated blood-pressure reading, without diagnosis of hypertension: Secondary | ICD-10-CM | POA: Diagnosis not present

## 2019-09-01 DIAGNOSIS — C44719 Basal cell carcinoma of skin of left lower limb, including hip: Secondary | ICD-10-CM | POA: Diagnosis not present

## 2019-09-07 ENCOUNTER — Telehealth: Payer: Self-pay | Admitting: Neurology

## 2019-09-07 DIAGNOSIS — G43019 Migraine without aura, intractable, without status migrainosus: Secondary | ICD-10-CM

## 2019-09-07 NOTE — Telephone Encounter (Signed)
As per our previous discussion and patient request, please initiate referral to [facility].

## 2019-09-07 NOTE — Telephone Encounter (Signed)
Pt is requesting a referral to [facility] to assist with her Migraines

## 2019-09-07 NOTE — Telephone Encounter (Signed)
Order placed. Please call pt and let her know when the referral to [facility] has been sent.

## 2019-09-07 NOTE — Addendum Note (Signed)
Addended by: Lester [place] A on: 09/07/2019 11:53 AM   Modules accepted: Orders

## 2019-09-11 NOTE — Telephone Encounter (Signed)
Referral has been sent to [facility] patient is aware and patient will also get her MRI disc to take with her to [facility].

## 2019-09-20 NOTE — Telephone Encounter (Signed)
Pt is calling in wanting to know if she still needs to keep her apt for 12/17 for her Botox if she is going to [facility] to treat her for her migraines

## 2019-09-20 NOTE — Telephone Encounter (Signed)
I think we can go ahead and cancel her appt for Botox and she can follow with [facility]. Please call patient back.

## 2019-09-20 NOTE — Telephone Encounter (Signed)
I called pt. Her appt with [facility] is 10/23/2019. She would rather keep her appt on 10/12/19 for botox and then after her appt with [facility] decide whether to continue getting botox with [facility] or not. Pt will keep the appt on 10/12/19.

## 2019-10-03 ENCOUNTER — Telehealth: Payer: Self-pay

## 2019-10-03 NOTE — Telephone Encounter (Signed)
I called pt to r/s her botox appt on 10/12/19 since Dr. Rexene Alberts will be out of the office that morning. If pt calls back, please reschedule this appt.

## 2019-10-04 ENCOUNTER — Ambulatory Visit
Admission: RE | Admit: 2019-10-04 | Discharge: 2019-10-04 | Disposition: A | Discharge status: Home / Self Care | Payer: Medicare Other | Source: Ambulatory Visit | Attending: Nurse Practitioner | Admitting: Nurse Practitioner

## 2019-10-04 ENCOUNTER — Other Ambulatory Visit: Payer: Self-pay

## 2019-10-04 DIAGNOSIS — Z1231 Encounter for screening mammogram for malignant neoplasm of breast: Secondary | ICD-10-CM

## 2019-10-12 ENCOUNTER — Telehealth: Payer: Self-pay

## 2019-10-12 ENCOUNTER — Ambulatory Visit: Payer: Medicare Other | Admitting: Neurology

## 2019-10-12 NOTE — Telephone Encounter (Signed)
I called pt. I advised her that Dr. Rexene Alberts will be out of the office on 10/16/19. Pt is agreeable to r/s her appt until 10/18/19 at 1:00pm, check in at 12:30pm. Pt verbalized understanding of new appt date and time.

## 2019-10-16 ENCOUNTER — Ambulatory Visit: Payer: Medicare Other | Admitting: Neurology

## 2019-10-18 ENCOUNTER — Other Ambulatory Visit: Payer: Self-pay

## 2019-10-18 ENCOUNTER — Encounter: Payer: Self-pay | Admitting: Neurology

## 2019-10-18 ENCOUNTER — Ambulatory Visit (INDEPENDENT_AMBULATORY_CARE_PROVIDER_SITE_OTHER): Payer: Medicare Other | Admitting: Neurology

## 2019-10-18 MED ORDER — ONABOTULINUMTOXINA 100 UNITS IJ SOLR: 100.0000 [IU] | Freq: Once | INTRAMUSCULAR | Status: DC

## 2019-10-18 NOTE — Progress Notes (Signed)
Patient did not receive Botox injections today.  She misunderstood and thought she was advised to keep the Botox injection for migraines.  She has an appointment with the [facility] headache specialist in 5 days from now and I had recommended that we forego this injection. It was my understanding that she wanted to keep the injection appointment but today she reports that she only kept it because she was advised to keep it. I apologized for the miscommunication.  I offered her to cancel this appointment and she is going to meet her new headache specialist at [facility] next week.  She is hoping that she will also get some relief with her neck pain which sometimes ties in with increase in her migraine frequency.  Sometimes the neck pain seems to be Independent of her migraines.  We mutually agreed to forego today's appointment and today's injections and she will transfer care to [facility].

## 2019-10-18 NOTE — Patient Instructions (Addendum)
Canceled today's appt.

## 2019-10-23 DIAGNOSIS — G43709 Chronic migraine without aura, not intractable, without status migrainosus: Secondary | ICD-10-CM | POA: Diagnosis not present

## 2019-10-23 DIAGNOSIS — M542 Cervicalgia: Secondary | ICD-10-CM | POA: Diagnosis not present

## 2019-10-25 ENCOUNTER — Other Ambulatory Visit: Payer: Self-pay | Admitting: Neurology

## 2019-11-07 ENCOUNTER — Other Ambulatory Visit: Payer: Self-pay | Admitting: Physician Assistant

## 2019-11-07 DIAGNOSIS — G509 Disorder of trigeminal nerve, unspecified: Secondary | ICD-10-CM | POA: Diagnosis not present

## 2019-11-07 DIAGNOSIS — Z1331 Encounter for screening for depression: Secondary | ICD-10-CM | POA: Diagnosis not present

## 2019-11-07 DIAGNOSIS — Z136 Encounter for screening for cardiovascular disorders: Secondary | ICD-10-CM | POA: Diagnosis not present

## 2019-11-07 DIAGNOSIS — E559 Vitamin D deficiency, unspecified: Secondary | ICD-10-CM | POA: Diagnosis not present

## 2019-11-07 DIAGNOSIS — Z780 Asymptomatic menopausal state: Secondary | ICD-10-CM | POA: Diagnosis not present

## 2019-11-07 DIAGNOSIS — G43909 Migraine, unspecified, not intractable, without status migrainosus: Secondary | ICD-10-CM | POA: Diagnosis not present

## 2019-11-07 DIAGNOSIS — Z13220 Encounter for screening for lipoid disorders: Secondary | ICD-10-CM | POA: Diagnosis not present

## 2019-11-07 DIAGNOSIS — Z1389 Encounter for screening for other disorder: Secondary | ICD-10-CM | POA: Diagnosis not present

## 2019-11-07 DIAGNOSIS — Z0000 Encounter for general adult medical examination without abnormal findings: Secondary | ICD-10-CM | POA: Diagnosis not present

## 2019-11-07 DIAGNOSIS — Z23 Encounter for immunization: Secondary | ICD-10-CM | POA: Diagnosis not present

## 2019-11-07 DIAGNOSIS — R030 Elevated blood-pressure reading, without diagnosis of hypertension: Secondary | ICD-10-CM | POA: Diagnosis not present

## 2019-11-07 DIAGNOSIS — E2839 Other primary ovarian failure: Secondary | ICD-10-CM

## 2019-11-16 DIAGNOSIS — M542 Cervicalgia: Secondary | ICD-10-CM | POA: Diagnosis not present

## 2019-11-16 DIAGNOSIS — G43709 Chronic migraine without aura, not intractable, without status migrainosus: Secondary | ICD-10-CM | POA: Diagnosis not present

## 2019-11-16 DIAGNOSIS — M5481 Occipital neuralgia: Secondary | ICD-10-CM | POA: Diagnosis not present

## 2019-11-21 DIAGNOSIS — I8391 Asymptomatic varicose veins of right lower extremity: Secondary | ICD-10-CM | POA: Diagnosis not present

## 2019-11-21 DIAGNOSIS — D1801 Hemangioma of skin and subcutaneous tissue: Secondary | ICD-10-CM | POA: Diagnosis not present

## 2019-11-21 DIAGNOSIS — C44519 Basal cell carcinoma of skin of other part of trunk: Secondary | ICD-10-CM | POA: Diagnosis not present

## 2019-11-21 DIAGNOSIS — L821 Other seborrheic keratosis: Secondary | ICD-10-CM | POA: Diagnosis not present

## 2019-11-21 DIAGNOSIS — L570 Actinic keratosis: Secondary | ICD-10-CM | POA: Diagnosis not present

## 2019-11-21 DIAGNOSIS — Z85828 Personal history of other malignant neoplasm of skin: Secondary | ICD-10-CM | POA: Diagnosis not present

## 2019-12-03 ENCOUNTER — Ambulatory Visit: Payer: Medicare Other

## 2019-12-19 ENCOUNTER — Ambulatory Visit: Payer: PRIVATE HEALTH INSURANCE

## 2020-01-16 DIAGNOSIS — R200 Anesthesia of skin: Secondary | ICD-10-CM | POA: Diagnosis not present

## 2020-01-16 DIAGNOSIS — G43709 Chronic migraine without aura, not intractable, without status migrainosus: Secondary | ICD-10-CM | POA: Diagnosis not present

## 2020-01-16 DIAGNOSIS — M5481 Occipital neuralgia: Secondary | ICD-10-CM | POA: Diagnosis not present

## 2020-01-16 DIAGNOSIS — Z87891 Personal history of nicotine dependence: Secondary | ICD-10-CM | POA: Diagnosis not present

## 2020-01-16 DIAGNOSIS — M542 Cervicalgia: Secondary | ICD-10-CM | POA: Diagnosis not present

## 2020-01-23 ENCOUNTER — Ambulatory Visit
Admission: RE | Admit: 2020-01-23 | Discharge: 2020-01-23 | Disposition: A | Discharge status: Home / Self Care | Payer: Medicare Other | Source: Ambulatory Visit | Attending: Physician Assistant | Admitting: Physician Assistant

## 2020-01-23 ENCOUNTER — Other Ambulatory Visit: Payer: Self-pay

## 2020-01-23 DIAGNOSIS — Z780 Asymptomatic menopausal state: Secondary | ICD-10-CM | POA: Diagnosis not present

## 2020-01-23 DIAGNOSIS — M85851 Other specified disorders of bone density and structure, right thigh: Secondary | ICD-10-CM | POA: Diagnosis not present

## 2020-01-23 DIAGNOSIS — E2839 Other primary ovarian failure: Secondary | ICD-10-CM

## 2020-02-05 DIAGNOSIS — G43709 Chronic migraine without aura, not intractable, without status migrainosus: Secondary | ICD-10-CM | POA: Diagnosis not present

## 2020-04-03 DIAGNOSIS — H52203 Unspecified astigmatism, bilateral: Secondary | ICD-10-CM | POA: Diagnosis not present

## 2020-04-03 DIAGNOSIS — Z961 Presence of intraocular lens: Secondary | ICD-10-CM | POA: Diagnosis not present

## 2020-04-08 ENCOUNTER — Other Ambulatory Visit: Payer: Self-pay | Admitting: Internal Medicine

## 2020-04-08 ENCOUNTER — Ambulatory Visit
Admission: RE | Admit: 2020-04-08 | Discharge: 2020-04-08 | Disposition: A | Discharge status: Home / Self Care | Payer: Medicare Other | Source: Ambulatory Visit | Attending: Internal Medicine | Admitting: Internal Medicine

## 2020-04-08 DIAGNOSIS — S92344A Nondisplaced fracture of fourth metatarsal bone, right foot, initial encounter for closed fracture: Secondary | ICD-10-CM | POA: Diagnosis not present

## 2020-04-08 DIAGNOSIS — M79671 Pain in right foot: Secondary | ICD-10-CM

## 2020-04-16 ENCOUNTER — Other Ambulatory Visit: Payer: Self-pay | Admitting: Neurology

## 2020-05-02 DIAGNOSIS — S92344D Nondisplaced fracture of fourth metatarsal bone, right foot, subsequent encounter for fracture with routine healing: Secondary | ICD-10-CM | POA: Diagnosis not present

## 2020-05-06 DIAGNOSIS — G43709 Chronic migraine without aura, not intractable, without status migrainosus: Secondary | ICD-10-CM | POA: Diagnosis not present

## 2020-05-20 DIAGNOSIS — L821 Other seborrheic keratosis: Secondary | ICD-10-CM | POA: Diagnosis not present

## 2020-05-20 DIAGNOSIS — L570 Actinic keratosis: Secondary | ICD-10-CM | POA: Diagnosis not present

## 2020-05-20 DIAGNOSIS — L814 Other melanin hyperpigmentation: Secondary | ICD-10-CM | POA: Diagnosis not present

## 2020-05-20 DIAGNOSIS — Z85828 Personal history of other malignant neoplasm of skin: Secondary | ICD-10-CM | POA: Diagnosis not present

## 2020-05-20 DIAGNOSIS — D485 Neoplasm of uncertain behavior of skin: Secondary | ICD-10-CM | POA: Diagnosis not present

## 2020-05-25 DIAGNOSIS — G43709 Chronic migraine without aura, not intractable, without status migrainosus: Secondary | ICD-10-CM | POA: Diagnosis not present

## 2020-07-23 DIAGNOSIS — Z23 Encounter for immunization: Secondary | ICD-10-CM | POA: Diagnosis not present

## 2020-07-29 DIAGNOSIS — M542 Cervicalgia: Secondary | ICD-10-CM | POA: Diagnosis not present

## 2020-07-29 DIAGNOSIS — G43709 Chronic migraine without aura, not intractable, without status migrainosus: Secondary | ICD-10-CM | POA: Diagnosis not present

## 2020-08-02 ENCOUNTER — Ambulatory Visit (INDEPENDENT_AMBULATORY_CARE_PROVIDER_SITE_OTHER): Payer: Medicare Other | Admitting: Podiatry

## 2020-08-02 ENCOUNTER — Encounter: Payer: Self-pay | Admitting: Podiatry

## 2020-08-02 ENCOUNTER — Other Ambulatory Visit: Payer: Self-pay

## 2020-08-02 DIAGNOSIS — D3610 Benign neoplasm of peripheral nerves and autonomic nervous system, unspecified: Secondary | ICD-10-CM | POA: Diagnosis not present

## 2020-08-03 NOTE — Progress Notes (Signed)
Subjective:   Patient ID: Rebecca Huang, female   DOB: 74 y.o.   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

## 2020-08-10 DIAGNOSIS — Z885 Allergy status to narcotic agent status: Secondary | ICD-10-CM | POA: Diagnosis not present

## 2020-08-10 DIAGNOSIS — S161XXA Strain of muscle, fascia and tendon at neck level, initial encounter: Secondary | ICD-10-CM | POA: Diagnosis not present

## 2020-08-10 DIAGNOSIS — Z7982 Long term (current) use of aspirin: Secondary | ICD-10-CM | POA: Diagnosis not present

## 2020-08-10 DIAGNOSIS — R52 Pain, unspecified: Secondary | ICD-10-CM | POA: Diagnosis not present

## 2020-08-10 DIAGNOSIS — Z888 Allergy status to other drugs, medicaments and biological substances status: Secondary | ICD-10-CM | POA: Diagnosis not present

## 2020-08-10 DIAGNOSIS — M542 Cervicalgia: Secondary | ICD-10-CM | POA: Diagnosis not present

## 2020-08-10 DIAGNOSIS — R079 Chest pain, unspecified: Secondary | ICD-10-CM | POA: Diagnosis not present

## 2020-08-10 DIAGNOSIS — S0990XA Unspecified injury of head, initial encounter: Secondary | ICD-10-CM | POA: Diagnosis not present

## 2020-08-10 DIAGNOSIS — R0602 Shortness of breath: Secondary | ICD-10-CM | POA: Diagnosis not present

## 2020-08-10 DIAGNOSIS — R0789 Other chest pain: Secondary | ICD-10-CM | POA: Diagnosis not present

## 2020-08-10 DIAGNOSIS — M25519 Pain in unspecified shoulder: Secondary | ICD-10-CM | POA: Diagnosis not present

## 2020-08-10 DIAGNOSIS — W19XXXA Unspecified fall, initial encounter: Secondary | ICD-10-CM | POA: Diagnosis not present

## 2020-08-10 DIAGNOSIS — I10 Essential (primary) hypertension: Secondary | ICD-10-CM | POA: Diagnosis not present

## 2020-08-20 DIAGNOSIS — E559 Vitamin D deficiency, unspecified: Secondary | ICD-10-CM | POA: Diagnosis not present

## 2020-08-20 DIAGNOSIS — W19XXXD Unspecified fall, subsequent encounter: Secondary | ICD-10-CM | POA: Diagnosis not present

## 2020-08-20 DIAGNOSIS — G43909 Migraine, unspecified, not intractable, without status migrainosus: Secondary | ICD-10-CM | POA: Diagnosis not present

## 2020-08-21 DIAGNOSIS — Z23 Encounter for immunization: Secondary | ICD-10-CM | POA: Diagnosis not present

## 2020-09-09 ENCOUNTER — Encounter: Payer: Self-pay | Admitting: Podiatry

## 2020-09-09 ENCOUNTER — Ambulatory Visit (INDEPENDENT_AMBULATORY_CARE_PROVIDER_SITE_OTHER): Payer: Medicare Other | Admitting: Podiatry

## 2020-09-09 ENCOUNTER — Other Ambulatory Visit: Payer: Self-pay

## 2020-09-09 DIAGNOSIS — D3610 Benign neoplasm of peripheral nerves and autonomic nervous system, unspecified: Secondary | ICD-10-CM

## 2020-09-12 NOTE — Progress Notes (Signed)
Subjective:   Patient ID: Rebecca Huang, female   DOB: 74 y.o.   MRN: [id number]   HPI Patient states she did not have any relief from her last injection and she knows that she is probably can have to have surgery on this foot.  Patient states it is been very sore and makes wearing shoe gear difficult   ROS      Objective:  Physical Exam  Neurovascular status intact with patient found to have exquisite discomfort second interspace left with radiating pain and positive Mulder sign.  There is no pain currently to the metatarsal phalangeal joints and it appears to be strictly the interspace     Assessment:  Strong possibility for neuroma symptomatology left second interspace     Plan:  H&P reviewed condition and I recommended at this point excision of soft tissue mass probable neuroma.  I explained the procedure and allowed her to read consent form going over alternative treatments complications associated with this condition.  Patient wants to have surgery understanding risk and after extensive review signed consent form.  Patient scheduled for outpatient surgery [facility] and is encouraged to call with questions and does understand that this is a clinical diagnosis and there is no guarantee this will solve her problem

## 2020-09-13 DIAGNOSIS — Z9189 Other specified personal risk factors, not elsewhere classified: Secondary | ICD-10-CM | POA: Diagnosis not present

## 2020-09-13 DIAGNOSIS — Z809 Family history of malignant neoplasm, unspecified: Secondary | ICD-10-CM | POA: Diagnosis not present

## 2020-09-16 ENCOUNTER — Telehealth: Payer: Self-pay | Admitting: Genetic Counselor

## 2020-09-16 NOTE — Telephone Encounter (Signed)
Scheduled appointment per 11/22 new patient referral. Spoke to patient who is aware of appointment date and time.  

## 2020-09-24 ENCOUNTER — Other Ambulatory Visit: Payer: Self-pay | Admitting: Obstetrics and Gynecology

## 2020-09-24 DIAGNOSIS — Z1231 Encounter for screening mammogram for malignant neoplasm of breast: Secondary | ICD-10-CM

## 2020-10-07 ENCOUNTER — Ambulatory Visit
Admission: RE | Admit: 2020-10-07 | Discharge: 2020-10-07 | Disposition: A | Discharge status: Home / Self Care | Payer: Medicare Other | Source: Ambulatory Visit | Attending: Obstetrics and Gynecology | Admitting: Obstetrics and Gynecology

## 2020-10-07 ENCOUNTER — Other Ambulatory Visit: Payer: Self-pay

## 2020-10-07 DIAGNOSIS — Z1231 Encounter for screening mammogram for malignant neoplasm of breast: Secondary | ICD-10-CM | POA: Diagnosis not present

## 2020-10-10 ENCOUNTER — Other Ambulatory Visit: Payer: Self-pay

## 2020-10-10 ENCOUNTER — Inpatient Hospital Stay: Payer: Medicare Other

## 2020-10-10 ENCOUNTER — Encounter: Payer: Self-pay | Admitting: Genetic Counselor

## 2020-10-10 ENCOUNTER — Inpatient Hospital Stay: Payer: Medicare Other | Attending: Genetic Counselor | Admitting: Genetic Counselor

## 2020-10-10 DIAGNOSIS — Z800 Family history of malignant neoplasm of digestive organs: Secondary | ICD-10-CM

## 2020-10-10 DIAGNOSIS — Z8481 Family history of carrier of genetic disease: Secondary | ICD-10-CM | POA: Insufficient documentation

## 2020-10-10 DIAGNOSIS — Z808 Family history of malignant neoplasm of other organs or systems: Secondary | ICD-10-CM | POA: Insufficient documentation

## 2020-10-10 DIAGNOSIS — Z803 Family history of malignant neoplasm of breast: Secondary | ICD-10-CM

## 2020-10-10 DIAGNOSIS — Z8041 Family history of malignant neoplasm of ovary: Secondary | ICD-10-CM | POA: Insufficient documentation

## 2020-10-10 NOTE — Progress Notes (Signed)
REFERRING PROVIDER: Drema Dallas, DO [address],  [place] [address]  PRIMARY PROVIDER:  Josetta Huddle, MD  PRIMARY REASON FOR VISIT:  1. Family history of gene mutation   2. Family history of colon cancer   3. Family history of melanoma   4. Family history of breast cancer   5. Family history of ovarian cancer       HISTORY OF PRESENT ILLNESS:   Ms. Schillaci, a 74 y.o. female, was seen for a [place] cancer genetics consultation at the request of Dr. Delora Fuel due to a family history of a known CDKN2A gene mutation and cancer.  Ms. Bhullar presents to clinic today to discuss the possibility of a hereditary predisposition to cancer, genetic testing, and to further clarify her future cancer risks, as well as potential cancer risks for family members.   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    RISK FACTORS:  Menarche was at age 12.  First live birth at age 49.  OCP use for approximately 5-6 years.  Ovaries intact: yes.  Hysterectomy: no.  Menopausal status: postmenopausal.  HRT use: yes. Colonoscopy: yes; 2020 - normal. Mammogram within the last year: no, 10/04/2019. Number of breast biopsies: 1 - 1990s. Any excessive radiation exposure in the past: no   Past Medical History:  Diagnosis Date   Arthritis    Chronic kidney disease    Chronic low back pain    Family history of breast cancer    Family history of colon cancer    Family history of gene mutation    CDKN2A   Family history of melanoma    Family history of ovarian cancer    Hypertension    IBS (irritable bowel syndrome)    Migraine    Scoliosis     Past Surgical History:  Procedure Laterality Date   APPENDECTOMY     BREAST EXCISIONAL BIOPSY Left    HIP SURGERY     KNEE SURGERY     TUBAL LIGATION      Social History   Socioeconomic History   Marital status: Married    Spouse name: Not on file    Number of children: Not on file   Years of education: Not on file   Highest education level: Not on file  Occupational History   Not on file  Tobacco Use   Smoking status: Never Smoker   Smokeless tobacco: Never Used  Substance and Sexual Activity   Alcohol use: No   Drug use: No   Sexual activity: Not on file  Other Topics Concern   Not on file  Social History Narrative   Not on file   Social Determinants of Health   Financial Resource Strain: Not on file  Food Insecurity: Not on file  Transportation Needs: Not on file  Physical Activity: Not on file  Stress: Not on file  Social Connections: Not on file     FAMILY HISTORY:  We obtained a detailed, 4-generation family history.  Significant diagnoses are listed below: Family History  Problem Relation Age of Onset   Heart disease Mother    Heart disease Sister    Other Sister        CDKN2A gene mutation   Melanoma Sister 68   Colon cancer Father 65   Breast cancer Maternal Aunt        dx >50   Melanoma Maternal Aunt  multiple   Colon cancer Maternal Uncle    Ovarian cancer Cousin 16       (maternal first cousin)   Ovarian cancer Cousin 26       dysgerminoma? (maternal first cousin)   Colon cancer Son 63   Ms. Monter has one son and one daughter. Her son was recently diagnosed with colon cancer at the age of 23. She has one sister who has a history of melanoma and abnormal genetic testing for a mutation in the CDKN2A gene. She also had one brother who died at age 69.   Ms. Blanchet mother died at age 75 and did not have cancer. Ms. Ruperto had three maternal uncles and four maternal aunts. One uncle had colon cancer (diagnosis age unknown). One aunt had multiple melanomas and possibly lung cancer. Another aunt had breast cancer diagnosed older than 48. Ms. Wahler notes two maternal first cousins with ovarian cancer - one diagnosed at age 19 (dysgerminoma), and one diagnosed at age 60. Her  maternal grandmother died at age 28, and her maternal grandfather died older than 41.   Ms. Munley father died at age 65 and had a history of colon cancer diagnosed at age 80. She had one paternal aunt and one paternal uncle. Her paternal grandmother died at age 21 and her paternal grandfather died older than 38. There are no other known diagnoses of cancer on the paternal side of the family.  Ms. Selvy is aware of previous family history of genetic testing for hereditary cancer risks. Patient's ancestors are of unknown descent. There is no reported Ashkenazi Jewish ancestry. There is no known consanguinity.  GENETIC COUNSELING ASSESSMENT: Ms. Kneeland is a 74 y.o. female with a family history of a known CDKN2A gene mutation, as well as a family history of colon cancer, breast cancer and ovarian cancer, which is somewhat suggestive of a hereditary cancer syndrome and predisposition to cancer. We, therefore, discussed and recommended the following at today's visit.   DISCUSSION:  We discussed that Ms. Gaydos has a 50% (1 in 2) chance to also have the CDKN2A variant that was discovered in her sister. We reviewed the cancer risks that are associated with CDKN2A mutations, including an increased risk of melanoma and pancreatic cancer. Individuals with CDKN2A mutations may opt for increased cancer screening for associated cancer risks per the NCCN guidelines.  There are other genes that are known to increase the risk for the types of cancer seen in the family. Genes relating to breast, ovarian, and/or colon cancer include BRCA1/2, ATM, CHEK2, PALB2, the Lynch syndrome genes, BRIP1, RAD51C, RAD51D, etc. We discussed that testing is beneficial for several reasons, including knowing about other cancer risks, identifying potential screening and risk-reduction options that may be appropriate, and to understand if other family members could be at risk for cancer and allow them to undergo genetic testing.   We  reviewed the characteristics, features and inheritance patterns of hereditary cancer syndromes. We also discussed genetic testing, including the appropriate family members to test, the process of testing, insurance coverage, genetic discrimination, and turn-around-time for results. We discussed the implications of a  negative, positive and/or variant of uncertain significant result. We recommended Ms. Riggenbach pursue genetic testing for the [facility] Common Hereditary Cancers + RNA panel, which will include analysis of the known familial variant in the CDKN2A gene called c.9_32dup (p.Ala4_Pro11dup).   The Common Hereditary Cancers Panel offered by [facility] includes sequencing and/or deletion duplication testing of the following 47 genes: APC*,  ATM*, AXIN2*, BARD1*, BMPR1A*, BRCA1*, BRCA2*, BRIP1*, CDH1*, CDK4, CDKN2A (p14ARF), CDKN2A (p16INK4a), CHEK2*, CTNNA1*, DICER1*, EPCAM (Deletion/duplication testing only), GREM1 (promoter region deletion/duplication testing only), KIT, MEN1*, MLH1*, MSH2*, MSH3*, MSH6*, MUTYH*, NBN*, NF1*, NTHL1, PALB2*, PDGFRA, PMS2*, POLD1*, POLE*, PTEN*, RAD50*, RAD51C*, RAD51D*, SDHB*, SDHC*, SDHD*, SMAD4*, SMARCA4*, STK11*, TP53*, TSC1*, TSC2*, and VHL*.  The following genes were evaluated for sequence changes only: SDHA* and HOXB13 c.251G>A variant only. RNA analysis performed for * genes.   Based on Ms. Quintela's family history of a known CDKN2A mutation and cancer, she meets medical criteria for genetic testing. Despite that she meets criteria, there may still be an out of pocket cost.   PLAN: After considering the risks, benefits, and limitations, Ms. Castro provided informed consent to pursue genetic testing and the blood sample was sent to [facility] for analysis of the Common Hereditary Cancers + RNAinsight panel. Results should be available within approximately two-three weeks' time, at which point they will be disclosed by telephone to Ms. Mccary, as will any  additional recommendations warranted by these results. Ms. Selmer will receive a summary of her genetic counseling visit and a copy of her results once available. This information will also be available in Epic.   Ms. Oconnor questions were answered to her satisfaction today. Our contact information was provided should additional questions or concerns arise. Thank you for the referral and allowing [place] to share in the care of your patient.   Clint Guy, [place], [facility] Licensed, Certified Dispensing [email or web address] Phone: [phone]  The patient was seen for a total of 55 minutes in face-to-face genetic counseling.  This patient was discussed with Drs. Magrinat, Lindi Adie and/or Burr Medico who agrees with the above.    _______________________________________________________________________ For Office Staff:  Number of people involved in session: 1 Was an Intern/ student involved with case: no

## 2020-10-28 ENCOUNTER — Telehealth: Payer: Self-pay | Admitting: Genetic Counselor

## 2020-10-28 ENCOUNTER — Ambulatory Visit: Payer: Self-pay | Admitting: Genetic Counselor

## 2020-10-28 ENCOUNTER — Encounter: Payer: Self-pay | Admitting: Genetic Counselor

## 2020-10-28 DIAGNOSIS — Z1379 Encounter for other screening for genetic and chromosomal anomalies: Secondary | ICD-10-CM

## 2020-10-28 NOTE — Telephone Encounter (Signed)
Revealed negative genetic testing. She did not inherit the CDKN2A pathogenic variant that was previously identified in her sister. Discussed that we still do not know why there is colon, breast, and ovarian cancer in the family. There could be a genetic mutation in the family that Rebecca Huang did not inherit. There could also be a mutation in a different gene that we are not testing, or our current technology may not be able detect certain mutations. It will therefore be important for her to stay in contact with genetics to keep up with whether additional testing may be appropriate in the future.

## 2020-10-28 NOTE — Progress Notes (Signed)
HPI:  Ms. Spickler was previously seen in the [facility] clinic due to a family history of a known CDKN2A mutation and cancer, and concerns regarding a hereditary predisposition to cancer. Please refer to our prior cancer genetics clinic note for more information regarding our discussion, assessment and recommendations, at the time. Ms. Dawe recent genetic test results were disclosed to her, as were recommendations warranted by these results. These results and recommendations are discussed in more detail below.  FAMILY HISTORY:  We obtained a detailed, 4-generation family history.  Significant diagnoses are listed below: Family History  Problem Relation Age of Onset  . Heart disease Mother   . Heart disease Sister   . Other Sister        CDKN2A gene mutation  . Melanoma Sister 38  . Colon cancer Father 54  . Breast cancer Maternal Aunt        dx >50  . Melanoma Maternal Aunt        multiple  . Colon cancer Maternal Uncle   . Ovarian cancer Cousin 46       (maternal first cousin)  . Ovarian cancer Cousin 26       dysgerminoma? (maternal first cousin)  . Colon cancer Son 22    Ms. Valli has one son and one daughter. Her son was recently diagnosed with colon cancer at the age of 37. She has one sister who has a history of melanoma and abnormal genetic testing for a mutation in the CDKN2A gene. She also had one brother who died at age 71.   Ms. Maule mother died at age 64 and did not have cancer. Ms. Balbach had three maternal uncles and four maternal aunts. One uncle had colon cancer (diagnosis age unknown). One aunt had multiple melanomas and possibly lung cancer. Another aunt had breast cancer diagnosed older than 4. Ms. Miggins notes two maternal first cousins with ovarian cancer - one diagnosed at age 14 (dysgerminoma), and one diagnosed at age 31. Her maternal grandmother died at age 72, and her maternal grandfather died older than 16.   Ms. Rivere father died  at age 6 and had a history of colon cancer diagnosed at age 61. She had one paternal aunt and one paternal uncle. Her paternal grandmother died at age 72 and her paternal grandfather died older than 22. There are no other known diagnoses of cancer on the paternal side of the family.  Ms. Wenk is aware of previous family history of genetic testing for hereditary cancer risks. Patient's ancestors are of unknown descent. There is no reported Ashkenazi Jewish ancestry. There is no known consanguinity.  GENETIC TEST RESULTS: Genetic testing reported out on 10/28/2020 through the [facility] Common Hereditary Cancers + RNA panel. No pathogenic variants were detected.   The Common Hereditary Cancers Panel offered by [facility] includes sequencing and/or deletion duplication testing of the following 47genes: APC*, ATM*, AXIN2*, BARD1*, BMPR1A*, BRCA1*, BRCA2*, BRIP1*, CDH1*, CDK4, CDKN2A (p14ARF), CDKN2A (p16INK4a), CHEK2*, CTNNA1*, DICER1*, EPCAM (Deletion/duplication testing only), GREM1 (promoter region deletion/duplication testing only), KIT, MEN1*, MLH1*, MSH2*, MSH3*, MSH6*, MUTYH*, NBN*, NF1*, NTHL1, PALB2*, PDGFRA, PMS2*, POLD1*, POLE*, PTEN*, RAD50*, RAD51C*, RAD51D*, SDHB*, SDHC*, SDHD*, SMAD4*, SMARCA4*, STK11*, TP53*, TSC1*, TSC2*, and VHL*.  The following genes were evaluated for sequence changes only: SDHA* and HOXB13 c.251G>A variant only. RNA analysis performed for * genes. The test report will be scanned into EPIC and located under the Molecular Pathology section of the Results Review tab.  A portion of  the result report is included below for reference.     We discussed with Ms. Goodson that because current genetic testing is not perfect, it is possible there may be a gene mutation in one of these genes that current testing cannot detect, but that chance is small.  We also discussed that there could be another gene that has not yet been discovered, or that we have not yet tested, that is responsible for  the cancer diagnoses in the family. It is also possible there is a hereditary cause for the cancer in the family that Ms. Adee did not inherit and therefore was not identified in her testing.  Therefore, it is important to remain in touch with cancer genetics in the future so that we can continue to offer Ms. Mcjunkin the most up to date genetic testing.   We recommended Ms. Ballantine pursue testing for the familial hereditary cancer gene mutation in the CDKN2A gene called c.9_32dup (p.Ala4_Pro11duP). Ms. Baucum test was normal and did not reveal the familial mutation. We call this result a true negative result because a cancer-causing mutation was identified in Ms. Beagley's family, and she did not inherit it. Given this negative result, Ms. Eickholt's chances of developing pancreatic cancer is the same as it is in the general population. However, her risk for melanoma may still be elevated despite this negative result.  CANCER SCREENING RECOMMENDATIONS: Ms. Thomason test result is considered negative (normal).  This means that we have not identified a hereditary cause for her family history of cancer at this time. While reassuring, this does not definitively rule out a hereditary predisposition to cancer. It is still possible that there could be genetic mutations that are undetectable by current technology. There could be genetic mutations in genes that have not been tested or identified to increase cancer risk.  Therefore, it is recommended she continue to follow the cancer management and screening guidelines provided by her primary healthcare provider.   An individual's cancer risk and medical management are not determined by genetic test results alone. Overall cancer risk assessment incorporates additional factors, including personal medical history, family history, and any available genetic information that may result in a personalized plan for cancer prevention and surveillance.  Ms. Pilger has a family  history of colon cancer in multiple family members - her father (diagnosed age 20), her son (diagnosed age 73), and her maternal uncle (unknown age of diagnosis). Given this family history, Ms. Iten likely has an increased risk for colorectal cancer. We discussed that she should discuss colon cancer screening and potential referral to a GI physician with her referring provider.   Based on Ms. Marinos's personal and family history, as well as her genetic test results, a statistical model Midwife) was used to estimate her risk of developing breast cancer. Tyrer-Cuzick estimates her lifetime risk of developing breast cancer to be approximately 2.8%. This lifetime breast cancer risk is a preliminary estimate based on available information using one of several models endorsed by the [place] (ACS). The ACS recommends consideration of breast MRI screening as an adjunct to mammography for patients at high risk (defined as 20% or greater lifetime risk). A more detailed breast cancer risk assessment can be considered, if clinically indicated.        RECOMMENDATIONS FOR FAMILY MEMBERS:  Individuals in this family might be at some increased risk of developing cancer, over the general population risk, simply due to the family history of cancer.  We recommended women in  this family have a yearly mammogram beginning at age 19, or 51 years younger than the earliest onset of cancer, an annual clinical breast exam, and perform monthly breast self-exams. Women in this family should also have a gynecological exam as recommended by their primary provider. All family members should be referred for colonoscopy starting at age 50.  FOLLOW-UP: Lastly, we discussed with Ms. Foxworth that cancer genetics is a rapidly advancing field and it is possible that new genetic tests will be appropriate for her and/or her family members in the future. We encouraged her to remain in contact with cancer genetics on an annual  basis so we can update her personal and family histories and let her know of advances in cancer genetics that may benefit this family.   Our contact number was provided. Ms. Biehler questions were answered to her satisfaction, and she knows she is welcome to call us at anytime with additional questions or concerns.   Clint Guy, MS, [facility] Genetic Counselor [email or web address] Phone: [phone]

## 2020-11-04 DIAGNOSIS — G43709 Chronic migraine without aura, not intractable, without status migrainosus: Secondary | ICD-10-CM | POA: Diagnosis not present

## 2020-11-04 MED ORDER — HYDROCODONE-ACETAMINOPHEN 10-325 MG PO TABS: 1.0000 | ORAL_TABLET | Freq: Three times a day (TID) | ORAL | 0 refills | Status: AC | PRN

## 2020-11-04 NOTE — Addendum Note (Signed)
Addended by: Wallene Huh on: 11/04/2020 01:33 PM   Modules accepted: Orders

## 2020-11-05 DIAGNOSIS — T8734 Neuroma of amputation stump, left lower extremity: Secondary | ICD-10-CM | POA: Diagnosis not present

## 2020-11-05 DIAGNOSIS — G5762 Lesion of plantar nerve, left lower limb: Secondary | ICD-10-CM

## 2020-11-11 ENCOUNTER — Encounter: Payer: Medicare Other | Admitting: Podiatry

## 2020-11-13 ENCOUNTER — Other Ambulatory Visit: Payer: Self-pay

## 2020-11-13 ENCOUNTER — Ambulatory Visit (INDEPENDENT_AMBULATORY_CARE_PROVIDER_SITE_OTHER): Payer: Medicare Other

## 2020-11-13 ENCOUNTER — Ambulatory Visit (INDEPENDENT_AMBULATORY_CARE_PROVIDER_SITE_OTHER): Payer: Medicare Other | Admitting: Podiatry

## 2020-11-13 ENCOUNTER — Encounter: Payer: Self-pay | Admitting: Podiatry

## 2020-11-13 DIAGNOSIS — M79672 Pain in left foot: Secondary | ICD-10-CM | POA: Diagnosis not present

## 2020-11-13 NOTE — Progress Notes (Signed)
Subjective:   Patient ID: Rebecca Huang, female   DOB: 75 y.o.   MRN: [id number]   HPI Patient states doing fine minimal discomfort and I do not think I have the same pain I had preoperatively   ROS      Objective:  Physical Exam  Neurovascular status intact negative Bevelyn Buckles' sign noted second interspace incision healing well wound edges well coapted      Assessment:  Doing well post neurectomy second interspace left foot     Plan:  Sterile dressing reapplied continue elevation ankle compression stocking dispense gradual return to soft shoes over the next few weeks reappoint as needed

## 2020-11-19 DIAGNOSIS — M4802 Spinal stenosis, cervical region: Secondary | ICD-10-CM | POA: Diagnosis not present

## 2020-11-19 DIAGNOSIS — M542 Cervicalgia: Secondary | ICD-10-CM | POA: Diagnosis not present

## 2020-11-19 DIAGNOSIS — G43709 Chronic migraine without aura, not intractable, without status migrainosus: Secondary | ICD-10-CM | POA: Diagnosis not present

## 2020-11-19 DIAGNOSIS — M47812 Spondylosis without myelopathy or radiculopathy, cervical region: Secondary | ICD-10-CM | POA: Diagnosis not present

## 2020-11-20 ENCOUNTER — Encounter: Payer: Self-pay | Admitting: Podiatry

## 2020-11-21 DIAGNOSIS — D2239 Melanocytic nevi of other parts of face: Secondary | ICD-10-CM | POA: Diagnosis not present

## 2020-11-21 DIAGNOSIS — L821 Other seborrheic keratosis: Secondary | ICD-10-CM | POA: Diagnosis not present

## 2020-11-21 DIAGNOSIS — D485 Neoplasm of uncertain behavior of skin: Secondary | ICD-10-CM | POA: Diagnosis not present

## 2020-11-21 DIAGNOSIS — L814 Other melanin hyperpigmentation: Secondary | ICD-10-CM | POA: Diagnosis not present

## 2020-11-21 DIAGNOSIS — L570 Actinic keratosis: Secondary | ICD-10-CM | POA: Diagnosis not present

## 2020-11-22 DIAGNOSIS — T148XXA Other injury of unspecified body region, initial encounter: Secondary | ICD-10-CM | POA: Diagnosis not present

## 2020-11-22 DIAGNOSIS — Z0000 Encounter for general adult medical examination without abnormal findings: Secondary | ICD-10-CM | POA: Diagnosis not present

## 2020-11-22 DIAGNOSIS — R030 Elevated blood-pressure reading, without diagnosis of hypertension: Secondary | ICD-10-CM | POA: Diagnosis not present

## 2020-11-22 DIAGNOSIS — W19XXXD Unspecified fall, subsequent encounter: Secondary | ICD-10-CM | POA: Diagnosis not present

## 2020-11-22 DIAGNOSIS — E539 Vitamin B deficiency, unspecified: Secondary | ICD-10-CM | POA: Diagnosis not present

## 2020-11-22 DIAGNOSIS — G43909 Migraine, unspecified, not intractable, without status migrainosus: Secondary | ICD-10-CM | POA: Diagnosis not present

## 2020-11-22 DIAGNOSIS — Z1389 Encounter for screening for other disorder: Secondary | ICD-10-CM | POA: Diagnosis not present

## 2020-11-22 DIAGNOSIS — E559 Vitamin D deficiency, unspecified: Secondary | ICD-10-CM | POA: Diagnosis not present

## 2020-12-27 DIAGNOSIS — M542 Cervicalgia: Secondary | ICD-10-CM | POA: Diagnosis not present

## 2020-12-27 DIAGNOSIS — M47812 Spondylosis without myelopathy or radiculopathy, cervical region: Secondary | ICD-10-CM | POA: Diagnosis not present

## 2021-01-01 ENCOUNTER — Encounter: Payer: Self-pay | Admitting: Podiatry

## 2021-01-01 ENCOUNTER — Other Ambulatory Visit: Payer: Self-pay

## 2021-01-01 ENCOUNTER — Ambulatory Visit (INDEPENDENT_AMBULATORY_CARE_PROVIDER_SITE_OTHER): Payer: Medicare Other | Admitting: Podiatry

## 2021-01-01 DIAGNOSIS — L02619 Cutaneous abscess of unspecified foot: Secondary | ICD-10-CM

## 2021-01-01 DIAGNOSIS — L03119 Cellulitis of unspecified part of limb: Secondary | ICD-10-CM | POA: Diagnosis not present

## 2021-01-01 NOTE — Progress Notes (Signed)
Subjective:   Patient ID: Rebecca Huang, female   DOB: 75 y.o.   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

## 2021-01-14 DIAGNOSIS — R030 Elevated blood-pressure reading, without diagnosis of hypertension: Secondary | ICD-10-CM | POA: Diagnosis not present

## 2021-01-27 DIAGNOSIS — G43709 Chronic migraine without aura, not intractable, without status migrainosus: Secondary | ICD-10-CM | POA: Diagnosis not present

## 2021-02-12 DIAGNOSIS — Z23 Encounter for immunization: Secondary | ICD-10-CM | POA: Diagnosis not present

## 2021-04-16 DIAGNOSIS — H52203 Unspecified astigmatism, bilateral: Secondary | ICD-10-CM | POA: Diagnosis not present

## 2021-04-16 DIAGNOSIS — Z961 Presence of intraocular lens: Secondary | ICD-10-CM | POA: Diagnosis not present

## 2021-04-16 DIAGNOSIS — D23112 Other benign neoplasm of skin of right lower eyelid, including canthus: Secondary | ICD-10-CM | POA: Diagnosis not present

## 2021-04-17 ENCOUNTER — Other Ambulatory Visit: Payer: Self-pay | Admitting: Internal Medicine

## 2021-04-17 DIAGNOSIS — R030 Elevated blood-pressure reading, without diagnosis of hypertension: Secondary | ICD-10-CM | POA: Diagnosis not present

## 2021-04-17 DIAGNOSIS — M5412 Radiculopathy, cervical region: Secondary | ICD-10-CM | POA: Diagnosis not present

## 2021-04-21 ENCOUNTER — Other Ambulatory Visit: Payer: Self-pay

## 2021-04-21 ENCOUNTER — Ambulatory Visit
Admission: RE | Admit: 2021-04-21 | Discharge: 2021-04-21 | Disposition: A | Discharge status: Home / Self Care | Payer: Medicare Other | Source: Ambulatory Visit | Attending: Internal Medicine | Admitting: Internal Medicine

## 2021-04-21 DIAGNOSIS — M4802 Spinal stenosis, cervical region: Secondary | ICD-10-CM | POA: Diagnosis not present

## 2021-04-21 DIAGNOSIS — M5412 Radiculopathy, cervical region: Secondary | ICD-10-CM

## 2021-04-30 DIAGNOSIS — G43709 Chronic migraine without aura, not intractable, without status migrainosus: Secondary | ICD-10-CM | POA: Diagnosis not present

## 2021-05-22 DIAGNOSIS — Z85828 Personal history of other malignant neoplasm of skin: Secondary | ICD-10-CM | POA: Diagnosis not present

## 2021-05-22 DIAGNOSIS — L570 Actinic keratosis: Secondary | ICD-10-CM | POA: Diagnosis not present

## 2021-05-22 DIAGNOSIS — L821 Other seborrheic keratosis: Secondary | ICD-10-CM | POA: Diagnosis not present

## 2021-05-22 DIAGNOSIS — D1801 Hemangioma of skin and subcutaneous tissue: Secondary | ICD-10-CM | POA: Diagnosis not present

## 2021-05-22 DIAGNOSIS — L814 Other melanin hyperpigmentation: Secondary | ICD-10-CM | POA: Diagnosis not present

## 2021-07-07 DIAGNOSIS — I10 Essential (primary) hypertension: Secondary | ICD-10-CM | POA: Diagnosis not present

## 2021-07-07 DIAGNOSIS — M5412 Radiculopathy, cervical region: Secondary | ICD-10-CM | POA: Diagnosis not present

## 2021-07-07 DIAGNOSIS — M542 Cervicalgia: Secondary | ICD-10-CM | POA: Diagnosis not present

## 2021-07-07 DIAGNOSIS — M5481 Occipital neuralgia: Secondary | ICD-10-CM | POA: Diagnosis not present

## 2021-07-07 DIAGNOSIS — Z681 Body mass index (BMI) 19 or less, adult: Secondary | ICD-10-CM | POA: Diagnosis not present

## 2021-07-16 DIAGNOSIS — E559 Vitamin D deficiency, unspecified: Secondary | ICD-10-CM | POA: Diagnosis not present

## 2021-07-16 DIAGNOSIS — Z23 Encounter for immunization: Secondary | ICD-10-CM | POA: Diagnosis not present

## 2021-07-16 DIAGNOSIS — Z658 Other specified problems related to psychosocial circumstances: Secondary | ICD-10-CM | POA: Diagnosis not present

## 2021-07-16 DIAGNOSIS — G43909 Migraine, unspecified, not intractable, without status migrainosus: Secondary | ICD-10-CM | POA: Diagnosis not present

## 2021-07-16 DIAGNOSIS — R030 Elevated blood-pressure reading, without diagnosis of hypertension: Secondary | ICD-10-CM | POA: Diagnosis not present

## 2021-07-22 DIAGNOSIS — I10 Essential (primary) hypertension: Secondary | ICD-10-CM | POA: Diagnosis not present

## 2021-07-22 DIAGNOSIS — M5412 Radiculopathy, cervical region: Secondary | ICD-10-CM | POA: Diagnosis not present

## 2021-07-22 DIAGNOSIS — M47812 Spondylosis without myelopathy or radiculopathy, cervical region: Secondary | ICD-10-CM | POA: Diagnosis not present

## 2021-08-05 DIAGNOSIS — G43709 Chronic migraine without aura, not intractable, without status migrainosus: Secondary | ICD-10-CM | POA: Diagnosis not present

## 2021-08-14 DIAGNOSIS — M47812 Spondylosis without myelopathy or radiculopathy, cervical region: Secondary | ICD-10-CM | POA: Diagnosis not present

## 2021-08-18 DIAGNOSIS — Z23 Encounter for immunization: Secondary | ICD-10-CM | POA: Diagnosis not present

## 2021-08-20 DIAGNOSIS — R252 Cramp and spasm: Secondary | ICD-10-CM | POA: Diagnosis not present

## 2021-08-20 DIAGNOSIS — R030 Elevated blood-pressure reading, without diagnosis of hypertension: Secondary | ICD-10-CM | POA: Diagnosis not present

## 2021-08-27 ENCOUNTER — Other Ambulatory Visit: Payer: Self-pay | Admitting: Obstetrics and Gynecology

## 2021-08-27 DIAGNOSIS — Z1231 Encounter for screening mammogram for malignant neoplasm of breast: Secondary | ICD-10-CM

## 2021-08-28 DIAGNOSIS — M542 Cervicalgia: Secondary | ICD-10-CM | POA: Diagnosis not present

## 2021-10-07 DIAGNOSIS — Z8041 Family history of malignant neoplasm of ovary: Secondary | ICD-10-CM | POA: Diagnosis not present

## 2021-10-07 DIAGNOSIS — Z9189 Other specified personal risk factors, not elsewhere classified: Secondary | ICD-10-CM | POA: Diagnosis not present

## 2021-10-08 ENCOUNTER — Ambulatory Visit
Admission: RE | Admit: 2021-10-08 | Discharge: 2021-10-08 | Disposition: A | Discharge status: Home / Self Care | Payer: Medicare Other | Source: Ambulatory Visit | Attending: Obstetrics and Gynecology | Admitting: Obstetrics and Gynecology

## 2021-10-08 DIAGNOSIS — Z1231 Encounter for screening mammogram for malignant neoplasm of breast: Secondary | ICD-10-CM | POA: Diagnosis not present

## 2021-10-28 DIAGNOSIS — G43709 Chronic migraine without aura, not intractable, without status migrainosus: Secondary | ICD-10-CM | POA: Diagnosis not present

## 2021-10-28 DIAGNOSIS — M542 Cervicalgia: Secondary | ICD-10-CM | POA: Diagnosis not present

## 2021-11-24 DIAGNOSIS — D1801 Hemangioma of skin and subcutaneous tissue: Secondary | ICD-10-CM | POA: Diagnosis not present

## 2021-11-24 DIAGNOSIS — L821 Other seborrheic keratosis: Secondary | ICD-10-CM | POA: Diagnosis not present

## 2021-11-24 DIAGNOSIS — L814 Other melanin hyperpigmentation: Secondary | ICD-10-CM | POA: Diagnosis not present

## 2021-11-24 DIAGNOSIS — L570 Actinic keratosis: Secondary | ICD-10-CM | POA: Diagnosis not present

## 2021-11-24 DIAGNOSIS — D2262 Melanocytic nevi of left upper limb, including shoulder: Secondary | ICD-10-CM | POA: Diagnosis not present

## 2021-11-24 DIAGNOSIS — L820 Inflamed seborrheic keratosis: Secondary | ICD-10-CM | POA: Diagnosis not present

## 2021-11-24 DIAGNOSIS — D485 Neoplasm of uncertain behavior of skin: Secondary | ICD-10-CM | POA: Diagnosis not present

## 2021-11-26 DIAGNOSIS — Z0000 Encounter for general adult medical examination without abnormal findings: Secondary | ICD-10-CM | POA: Diagnosis not present

## 2021-11-26 DIAGNOSIS — E559 Vitamin D deficiency, unspecified: Secondary | ICD-10-CM | POA: Diagnosis not present

## 2021-11-26 DIAGNOSIS — J309 Allergic rhinitis, unspecified: Secondary | ICD-10-CM | POA: Diagnosis not present

## 2021-11-26 DIAGNOSIS — G43909 Migraine, unspecified, not intractable, without status migrainosus: Secondary | ICD-10-CM | POA: Diagnosis not present

## 2021-11-26 DIAGNOSIS — R030 Elevated blood-pressure reading, without diagnosis of hypertension: Secondary | ICD-10-CM | POA: Diagnosis not present

## 2021-11-26 DIAGNOSIS — M5412 Radiculopathy, cervical region: Secondary | ICD-10-CM | POA: Diagnosis not present

## 2021-11-26 DIAGNOSIS — H04129 Dry eye syndrome of unspecified lacrimal gland: Secondary | ICD-10-CM | POA: Diagnosis not present

## 2021-11-26 DIAGNOSIS — Z658 Other specified problems related to psychosocial circumstances: Secondary | ICD-10-CM | POA: Diagnosis not present

## 2021-11-26 DIAGNOSIS — R252 Cramp and spasm: Secondary | ICD-10-CM | POA: Diagnosis not present

## 2021-11-26 DIAGNOSIS — Z1389 Encounter for screening for other disorder: Secondary | ICD-10-CM | POA: Diagnosis not present

## 2021-11-26 DIAGNOSIS — W19XXXD Unspecified fall, subsequent encounter: Secondary | ICD-10-CM | POA: Diagnosis not present

## 2021-11-26 DIAGNOSIS — Z23 Encounter for immunization: Secondary | ICD-10-CM | POA: Diagnosis not present

## 2021-11-26 DIAGNOSIS — R5383 Other fatigue: Secondary | ICD-10-CM | POA: Diagnosis not present

## 2021-12-01 DIAGNOSIS — M47812 Spondylosis without myelopathy or radiculopathy, cervical region: Secondary | ICD-10-CM | POA: Diagnosis not present

## 2022-01-01 DIAGNOSIS — M47812 Spondylosis without myelopathy or radiculopathy, cervical region: Secondary | ICD-10-CM | POA: Diagnosis not present

## 2022-02-02 DIAGNOSIS — M5481 Occipital neuralgia: Secondary | ICD-10-CM | POA: Diagnosis not present

## 2022-02-02 DIAGNOSIS — M47812 Spondylosis without myelopathy or radiculopathy, cervical region: Secondary | ICD-10-CM | POA: Diagnosis not present

## 2022-02-02 DIAGNOSIS — I10 Essential (primary) hypertension: Secondary | ICD-10-CM | POA: Diagnosis not present

## 2022-02-17 DIAGNOSIS — G43709 Chronic migraine without aura, not intractable, without status migrainosus: Secondary | ICD-10-CM | POA: Diagnosis not present

## 2022-03-03 DIAGNOSIS — M5481 Occipital neuralgia: Secondary | ICD-10-CM | POA: Diagnosis not present

## 2022-04-21 DIAGNOSIS — Z961 Presence of intraocular lens: Secondary | ICD-10-CM | POA: Diagnosis not present

## 2022-04-21 DIAGNOSIS — H52203 Unspecified astigmatism, bilateral: Secondary | ICD-10-CM | POA: Diagnosis not present

## 2022-04-22 DIAGNOSIS — M542 Cervicalgia: Secondary | ICD-10-CM | POA: Diagnosis not present

## 2022-04-27 DIAGNOSIS — M2569 Stiffness of other specified joint, not elsewhere classified: Secondary | ICD-10-CM | POA: Diagnosis not present

## 2022-04-27 DIAGNOSIS — R293 Abnormal posture: Secondary | ICD-10-CM | POA: Diagnosis not present

## 2022-04-27 DIAGNOSIS — M6281 Muscle weakness (generalized): Secondary | ICD-10-CM | POA: Diagnosis not present

## 2022-04-27 DIAGNOSIS — M542 Cervicalgia: Secondary | ICD-10-CM | POA: Diagnosis not present

## 2022-05-01 DIAGNOSIS — R293 Abnormal posture: Secondary | ICD-10-CM | POA: Diagnosis not present

## 2022-05-01 DIAGNOSIS — M6281 Muscle weakness (generalized): Secondary | ICD-10-CM | POA: Diagnosis not present

## 2022-05-01 DIAGNOSIS — M542 Cervicalgia: Secondary | ICD-10-CM | POA: Diagnosis not present

## 2022-05-01 DIAGNOSIS — M2569 Stiffness of other specified joint, not elsewhere classified: Secondary | ICD-10-CM | POA: Diagnosis not present

## 2022-05-04 DIAGNOSIS — M2569 Stiffness of other specified joint, not elsewhere classified: Secondary | ICD-10-CM | POA: Diagnosis not present

## 2022-05-04 DIAGNOSIS — M542 Cervicalgia: Secondary | ICD-10-CM | POA: Diagnosis not present

## 2022-05-04 DIAGNOSIS — R293 Abnormal posture: Secondary | ICD-10-CM | POA: Diagnosis not present

## 2022-05-04 DIAGNOSIS — M6281 Muscle weakness (generalized): Secondary | ICD-10-CM | POA: Diagnosis not present

## 2022-05-07 DIAGNOSIS — M542 Cervicalgia: Secondary | ICD-10-CM | POA: Diagnosis not present

## 2022-05-07 DIAGNOSIS — R293 Abnormal posture: Secondary | ICD-10-CM | POA: Diagnosis not present

## 2022-05-07 DIAGNOSIS — M6281 Muscle weakness (generalized): Secondary | ICD-10-CM | POA: Diagnosis not present

## 2022-05-07 DIAGNOSIS — M2569 Stiffness of other specified joint, not elsewhere classified: Secondary | ICD-10-CM | POA: Diagnosis not present

## 2022-05-12 DIAGNOSIS — M2569 Stiffness of other specified joint, not elsewhere classified: Secondary | ICD-10-CM | POA: Diagnosis not present

## 2022-05-12 DIAGNOSIS — M6281 Muscle weakness (generalized): Secondary | ICD-10-CM | POA: Diagnosis not present

## 2022-05-12 DIAGNOSIS — M542 Cervicalgia: Secondary | ICD-10-CM | POA: Diagnosis not present

## 2022-05-12 DIAGNOSIS — R293 Abnormal posture: Secondary | ICD-10-CM | POA: Diagnosis not present

## 2022-05-14 DIAGNOSIS — M6281 Muscle weakness (generalized): Secondary | ICD-10-CM | POA: Diagnosis not present

## 2022-05-14 DIAGNOSIS — R293 Abnormal posture: Secondary | ICD-10-CM | POA: Diagnosis not present

## 2022-05-14 DIAGNOSIS — M2569 Stiffness of other specified joint, not elsewhere classified: Secondary | ICD-10-CM | POA: Diagnosis not present

## 2022-05-14 DIAGNOSIS — M542 Cervicalgia: Secondary | ICD-10-CM | POA: Diagnosis not present

## 2022-05-19 DIAGNOSIS — M542 Cervicalgia: Secondary | ICD-10-CM | POA: Diagnosis not present

## 2022-05-19 DIAGNOSIS — M2569 Stiffness of other specified joint, not elsewhere classified: Secondary | ICD-10-CM | POA: Diagnosis not present

## 2022-05-19 DIAGNOSIS — M6281 Muscle weakness (generalized): Secondary | ICD-10-CM | POA: Diagnosis not present

## 2022-05-19 DIAGNOSIS — R293 Abnormal posture: Secondary | ICD-10-CM | POA: Diagnosis not present

## 2022-05-21 DIAGNOSIS — M2569 Stiffness of other specified joint, not elsewhere classified: Secondary | ICD-10-CM | POA: Diagnosis not present

## 2022-05-21 DIAGNOSIS — R293 Abnormal posture: Secondary | ICD-10-CM | POA: Diagnosis not present

## 2022-05-21 DIAGNOSIS — M542 Cervicalgia: Secondary | ICD-10-CM | POA: Diagnosis not present

## 2022-05-21 DIAGNOSIS — M6281 Muscle weakness (generalized): Secondary | ICD-10-CM | POA: Diagnosis not present

## 2022-05-25 DIAGNOSIS — L570 Actinic keratosis: Secondary | ICD-10-CM | POA: Diagnosis not present

## 2022-05-25 DIAGNOSIS — I8391 Asymptomatic varicose veins of right lower extremity: Secondary | ICD-10-CM | POA: Diagnosis not present

## 2022-05-25 DIAGNOSIS — L821 Other seborrheic keratosis: Secondary | ICD-10-CM | POA: Diagnosis not present

## 2022-05-25 DIAGNOSIS — I8392 Asymptomatic varicose veins of left lower extremity: Secondary | ICD-10-CM | POA: Diagnosis not present

## 2022-05-25 DIAGNOSIS — L720 Epidermal cyst: Secondary | ICD-10-CM | POA: Diagnosis not present

## 2022-05-25 DIAGNOSIS — D2239 Melanocytic nevi of other parts of face: Secondary | ICD-10-CM | POA: Diagnosis not present

## 2022-05-25 DIAGNOSIS — D225 Melanocytic nevi of trunk: Secondary | ICD-10-CM | POA: Diagnosis not present

## 2022-05-26 DIAGNOSIS — G43709 Chronic migraine without aura, not intractable, without status migrainosus: Secondary | ICD-10-CM | POA: Diagnosis not present

## 2022-05-27 DIAGNOSIS — M2569 Stiffness of other specified joint, not elsewhere classified: Secondary | ICD-10-CM | POA: Diagnosis not present

## 2022-05-27 DIAGNOSIS — R293 Abnormal posture: Secondary | ICD-10-CM | POA: Diagnosis not present

## 2022-05-27 DIAGNOSIS — M542 Cervicalgia: Secondary | ICD-10-CM | POA: Diagnosis not present

## 2022-05-27 DIAGNOSIS — M6281 Muscle weakness (generalized): Secondary | ICD-10-CM | POA: Diagnosis not present

## 2022-05-29 DIAGNOSIS — M2569 Stiffness of other specified joint, not elsewhere classified: Secondary | ICD-10-CM | POA: Diagnosis not present

## 2022-05-29 DIAGNOSIS — M542 Cervicalgia: Secondary | ICD-10-CM | POA: Diagnosis not present

## 2022-05-29 DIAGNOSIS — M6281 Muscle weakness (generalized): Secondary | ICD-10-CM | POA: Diagnosis not present

## 2022-05-29 DIAGNOSIS — R293 Abnormal posture: Secondary | ICD-10-CM | POA: Diagnosis not present

## 2022-06-02 DIAGNOSIS — M2569 Stiffness of other specified joint, not elsewhere classified: Secondary | ICD-10-CM | POA: Diagnosis not present

## 2022-06-02 DIAGNOSIS — R293 Abnormal posture: Secondary | ICD-10-CM | POA: Diagnosis not present

## 2022-06-02 DIAGNOSIS — M6281 Muscle weakness (generalized): Secondary | ICD-10-CM | POA: Diagnosis not present

## 2022-06-02 DIAGNOSIS — M542 Cervicalgia: Secondary | ICD-10-CM | POA: Diagnosis not present

## 2022-06-04 DIAGNOSIS — M542 Cervicalgia: Secondary | ICD-10-CM | POA: Diagnosis not present

## 2022-06-04 DIAGNOSIS — M6281 Muscle weakness (generalized): Secondary | ICD-10-CM | POA: Diagnosis not present

## 2022-06-04 DIAGNOSIS — M2569 Stiffness of other specified joint, not elsewhere classified: Secondary | ICD-10-CM | POA: Diagnosis not present

## 2022-06-04 DIAGNOSIS — R293 Abnormal posture: Secondary | ICD-10-CM | POA: Diagnosis not present

## 2022-06-08 DIAGNOSIS — R293 Abnormal posture: Secondary | ICD-10-CM | POA: Diagnosis not present

## 2022-06-08 DIAGNOSIS — M6281 Muscle weakness (generalized): Secondary | ICD-10-CM | POA: Diagnosis not present

## 2022-06-08 DIAGNOSIS — M2569 Stiffness of other specified joint, not elsewhere classified: Secondary | ICD-10-CM | POA: Diagnosis not present

## 2022-06-08 DIAGNOSIS — M542 Cervicalgia: Secondary | ICD-10-CM | POA: Diagnosis not present

## 2022-06-10 DIAGNOSIS — R293 Abnormal posture: Secondary | ICD-10-CM | POA: Diagnosis not present

## 2022-06-10 DIAGNOSIS — M6281 Muscle weakness (generalized): Secondary | ICD-10-CM | POA: Diagnosis not present

## 2022-06-10 DIAGNOSIS — M2569 Stiffness of other specified joint, not elsewhere classified: Secondary | ICD-10-CM | POA: Diagnosis not present

## 2022-06-10 DIAGNOSIS — M542 Cervicalgia: Secondary | ICD-10-CM | POA: Diagnosis not present

## 2022-06-15 DIAGNOSIS — M2569 Stiffness of other specified joint, not elsewhere classified: Secondary | ICD-10-CM | POA: Diagnosis not present

## 2022-06-15 DIAGNOSIS — M6281 Muscle weakness (generalized): Secondary | ICD-10-CM | POA: Diagnosis not present

## 2022-06-15 DIAGNOSIS — R293 Abnormal posture: Secondary | ICD-10-CM | POA: Diagnosis not present

## 2022-06-15 DIAGNOSIS — M542 Cervicalgia: Secondary | ICD-10-CM | POA: Diagnosis not present

## 2022-06-18 DIAGNOSIS — R293 Abnormal posture: Secondary | ICD-10-CM | POA: Diagnosis not present

## 2022-06-18 DIAGNOSIS — M2569 Stiffness of other specified joint, not elsewhere classified: Secondary | ICD-10-CM | POA: Diagnosis not present

## 2022-06-18 DIAGNOSIS — M542 Cervicalgia: Secondary | ICD-10-CM | POA: Diagnosis not present

## 2022-06-18 DIAGNOSIS — M6281 Muscle weakness (generalized): Secondary | ICD-10-CM | POA: Diagnosis not present

## 2022-07-01 DIAGNOSIS — M6281 Muscle weakness (generalized): Secondary | ICD-10-CM | POA: Diagnosis not present

## 2022-07-01 DIAGNOSIS — M542 Cervicalgia: Secondary | ICD-10-CM | POA: Diagnosis not present

## 2022-07-01 DIAGNOSIS — R293 Abnormal posture: Secondary | ICD-10-CM | POA: Diagnosis not present

## 2022-07-01 DIAGNOSIS — M2569 Stiffness of other specified joint, not elsewhere classified: Secondary | ICD-10-CM | POA: Diagnosis not present

## 2022-08-15 DIAGNOSIS — Z23 Encounter for immunization: Secondary | ICD-10-CM | POA: Diagnosis not present

## 2022-08-28 ENCOUNTER — Other Ambulatory Visit: Payer: Self-pay | Admitting: Obstetrics and Gynecology

## 2022-08-28 DIAGNOSIS — Z1231 Encounter for screening mammogram for malignant neoplasm of breast: Secondary | ICD-10-CM

## 2022-09-08 DIAGNOSIS — G43709 Chronic migraine without aura, not intractable, without status migrainosus: Secondary | ICD-10-CM | POA: Diagnosis not present

## 2022-10-29 ENCOUNTER — Ambulatory Visit
Admission: RE | Admit: 2022-10-29 | Discharge: 2022-10-29 | Disposition: A | Discharge status: Home / Self Care | Payer: Medicare Other | Source: Ambulatory Visit | Attending: Obstetrics and Gynecology | Admitting: Obstetrics and Gynecology

## 2022-10-29 DIAGNOSIS — Z1231 Encounter for screening mammogram for malignant neoplasm of breast: Secondary | ICD-10-CM

## 2022-10-30 DIAGNOSIS — Z9189 Other specified personal risk factors, not elsewhere classified: Secondary | ICD-10-CM | POA: Diagnosis not present

## 2022-10-30 DIAGNOSIS — Z809 Family history of malignant neoplasm, unspecified: Secondary | ICD-10-CM | POA: Diagnosis not present

## 2022-10-30 DIAGNOSIS — N949 Unspecified condition associated with female genital organs and menstrual cycle: Secondary | ICD-10-CM | POA: Diagnosis not present

## 2022-11-10 DIAGNOSIS — N949 Unspecified condition associated with female genital organs and menstrual cycle: Secondary | ICD-10-CM | POA: Diagnosis not present

## 2022-11-25 DIAGNOSIS — H04123 Dry eye syndrome of bilateral lacrimal glands: Secondary | ICD-10-CM | POA: Diagnosis not present

## 2022-12-01 DIAGNOSIS — G43709 Chronic migraine without aura, not intractable, without status migrainosus: Secondary | ICD-10-CM | POA: Diagnosis not present

## 2022-12-01 DIAGNOSIS — M542 Cervicalgia: Secondary | ICD-10-CM | POA: Diagnosis not present

## 2022-12-08 DIAGNOSIS — L820 Inflamed seborrheic keratosis: Secondary | ICD-10-CM | POA: Diagnosis not present

## 2023-01-20 DIAGNOSIS — M8589 Other specified disorders of bone density and structure, multiple sites: Secondary | ICD-10-CM | POA: Diagnosis not present

## 2023-01-20 DIAGNOSIS — Z0000 Encounter for general adult medical examination without abnormal findings: Secondary | ICD-10-CM | POA: Diagnosis not present

## 2023-01-20 DIAGNOSIS — Z1211 Encounter for screening for malignant neoplasm of colon: Secondary | ICD-10-CM | POA: Diagnosis not present

## 2023-01-20 DIAGNOSIS — Z1331 Encounter for screening for depression: Secondary | ICD-10-CM | POA: Diagnosis not present

## 2023-01-20 DIAGNOSIS — E559 Vitamin D deficiency, unspecified: Secondary | ICD-10-CM | POA: Diagnosis not present

## 2023-01-20 DIAGNOSIS — Z79899 Other long term (current) drug therapy: Secondary | ICD-10-CM | POA: Diagnosis not present

## 2023-01-20 DIAGNOSIS — I10 Essential (primary) hypertension: Secondary | ICD-10-CM | POA: Diagnosis not present

## 2023-01-20 DIAGNOSIS — G43909 Migraine, unspecified, not intractable, without status migrainosus: Secondary | ICD-10-CM | POA: Diagnosis not present

## 2023-01-20 DIAGNOSIS — E7889 Other lipoprotein metabolism disorders: Secondary | ICD-10-CM | POA: Diagnosis not present

## 2023-01-21 ENCOUNTER — Other Ambulatory Visit: Payer: Self-pay | Admitting: Internal Medicine

## 2023-01-21 DIAGNOSIS — M8589 Other specified disorders of bone density and structure, multiple sites: Secondary | ICD-10-CM

## 2023-02-01 IMAGING — MG MM DIGITAL SCREENING BILAT W/ TOMO AND CAD
8 series · 9 of 24 positions shown · non-contrast
Comparison: Previous exam(s).

CLINICAL DATA: Screening.

EXAM:
DIGITAL SCREENING BILATERAL MAMMOGRAM WITH TOMOSYNTHESIS AND CAD
TECHNIQUE: Bilateral screening digital craniocaudal and mediolateral oblique
mammograms were obtained. Bilateral screening digital breast
tomosynthesis was performed. The images were evaluated with
computer-aided detection.

[L MLO synth-2D]
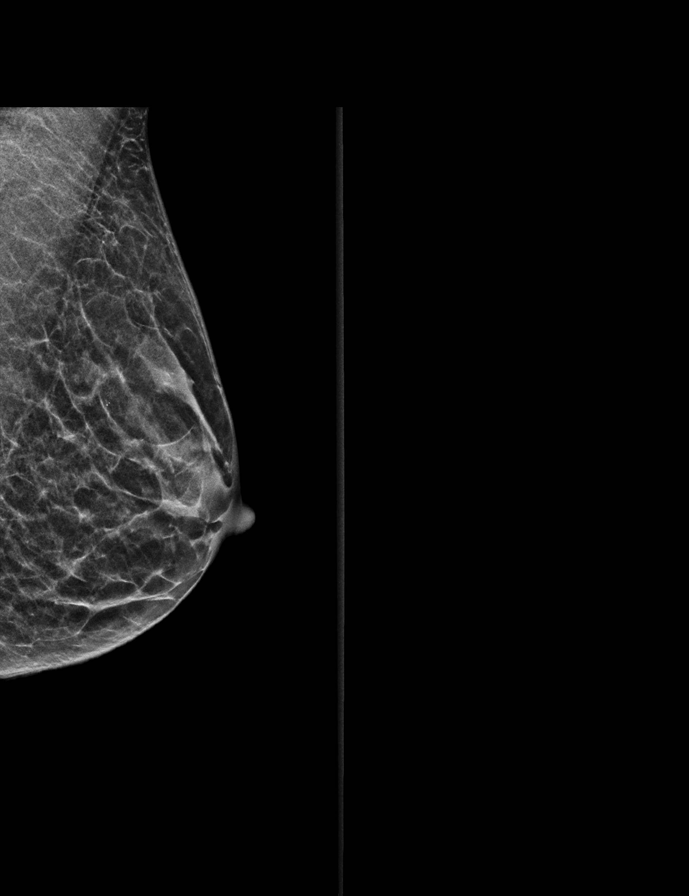

[R CC synth-2D]
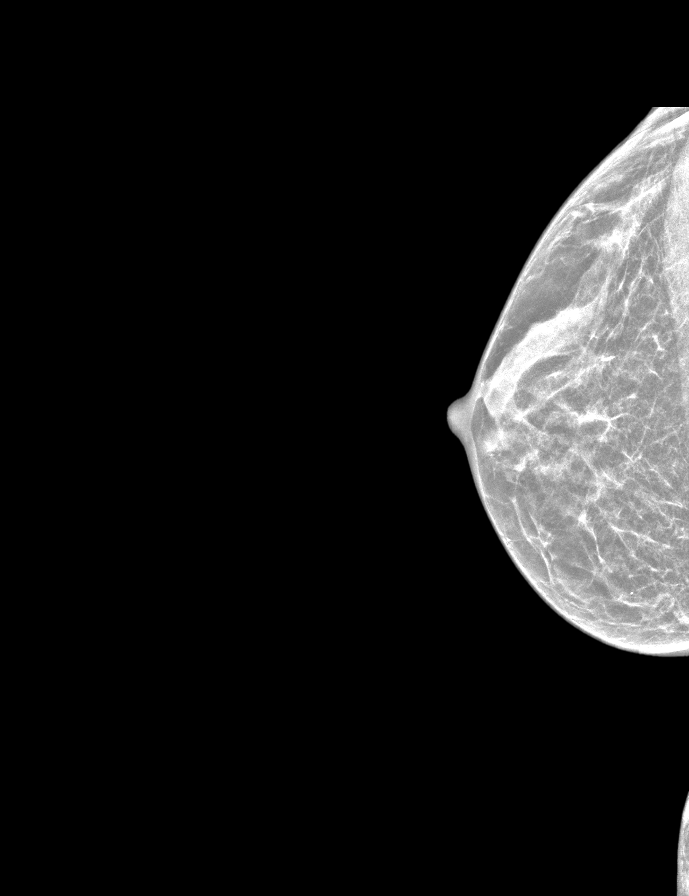

[L CC synth-2D]
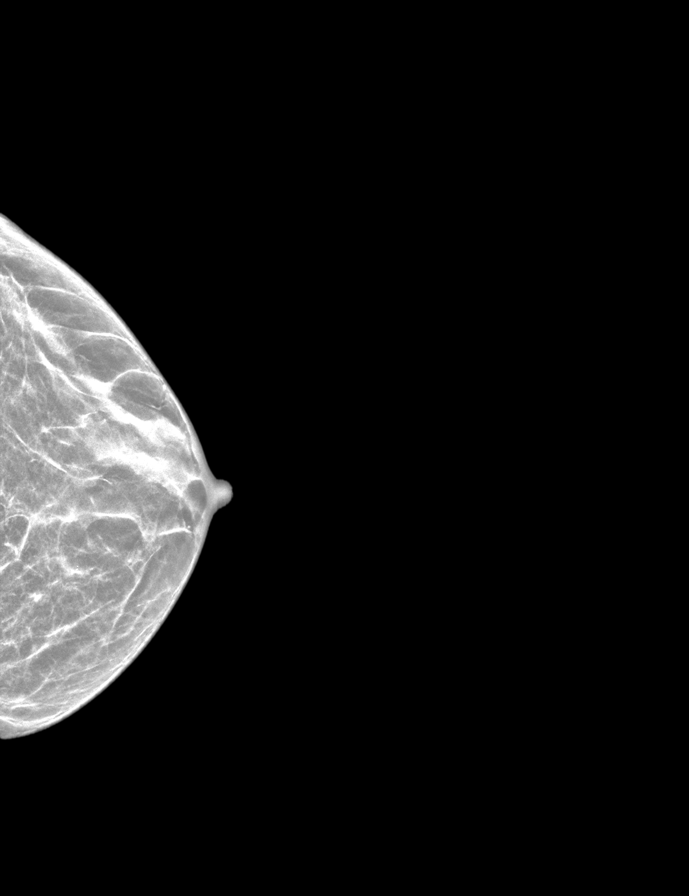

[R MLO synth-2D]
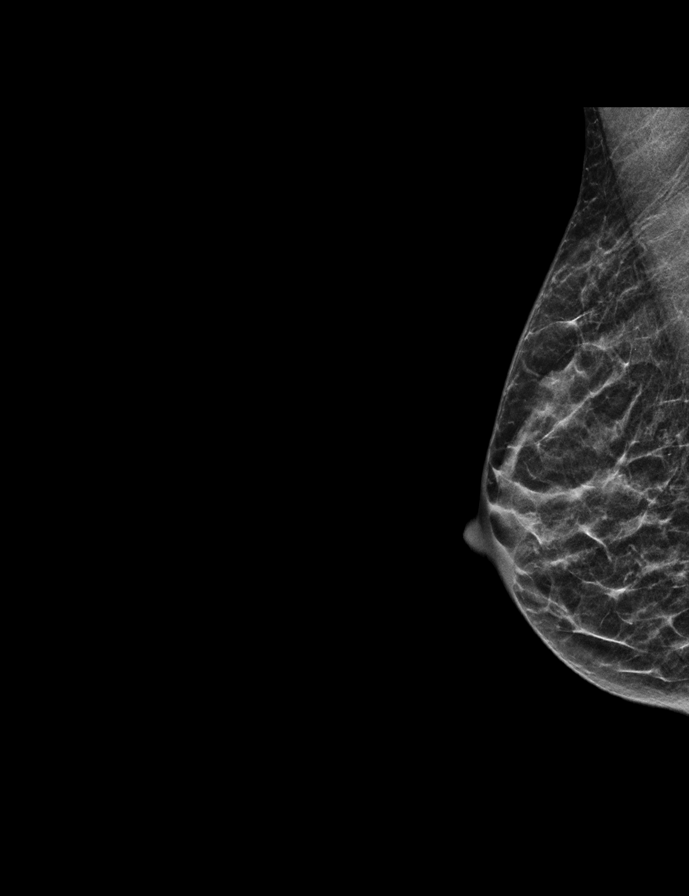

[L MLO tomo · 2 of 32 frames shown]
[frame 11/32]
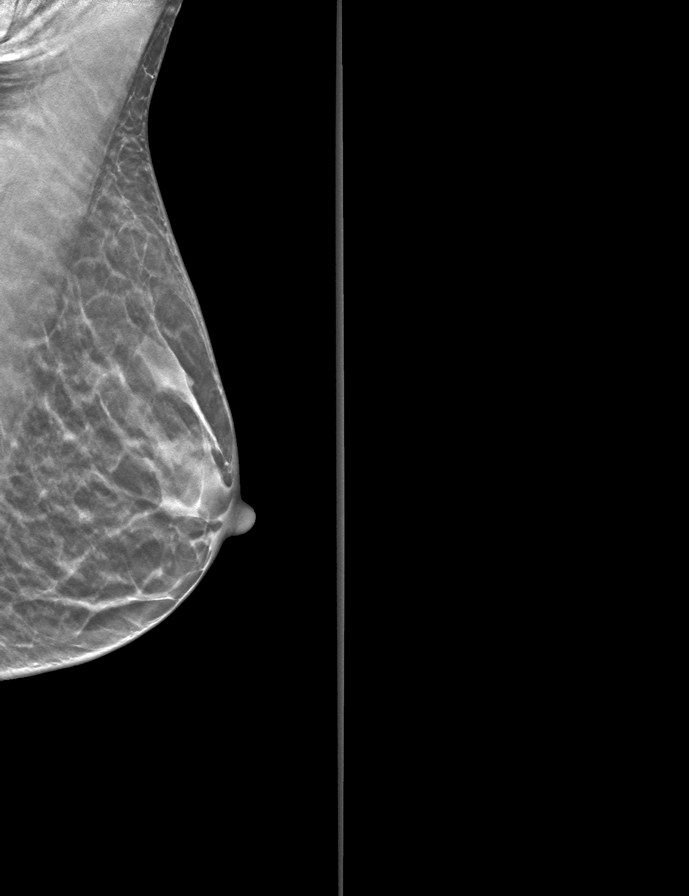
[frame 17/32]
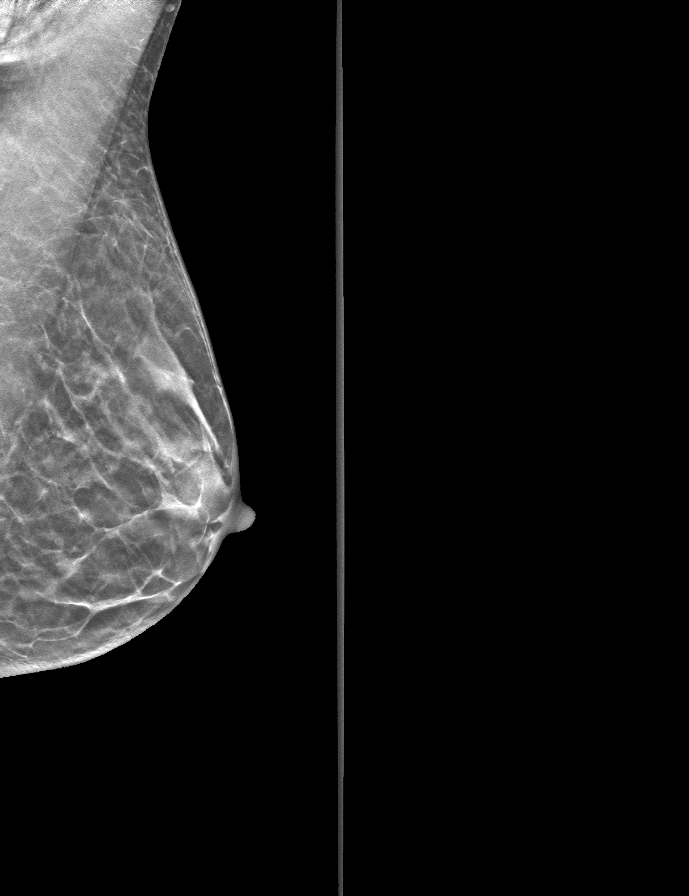

[L CC tomo · tomo slice 19/36.0]
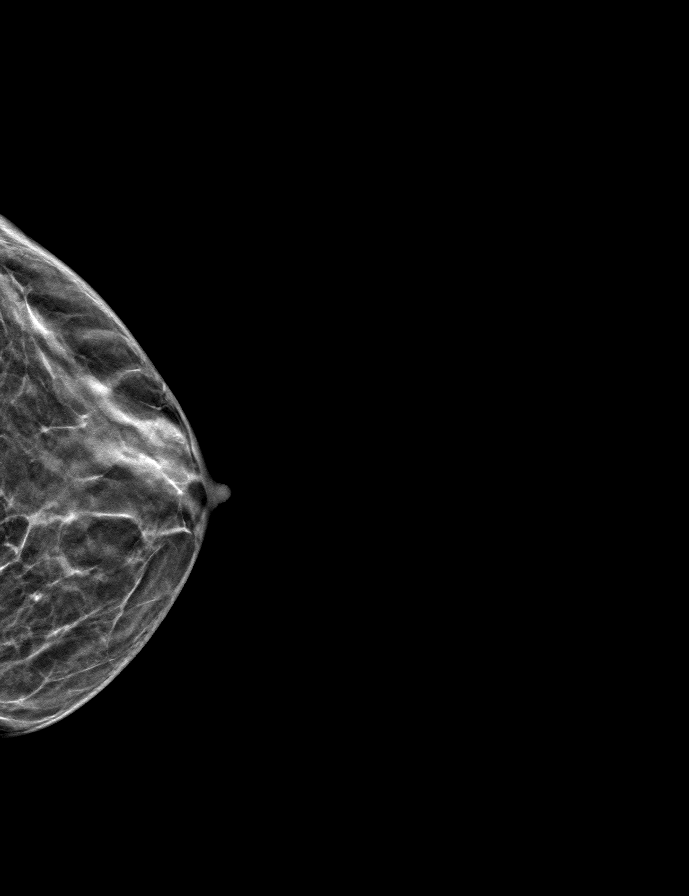

[R CC tomo · tomo slice 17/33.0]
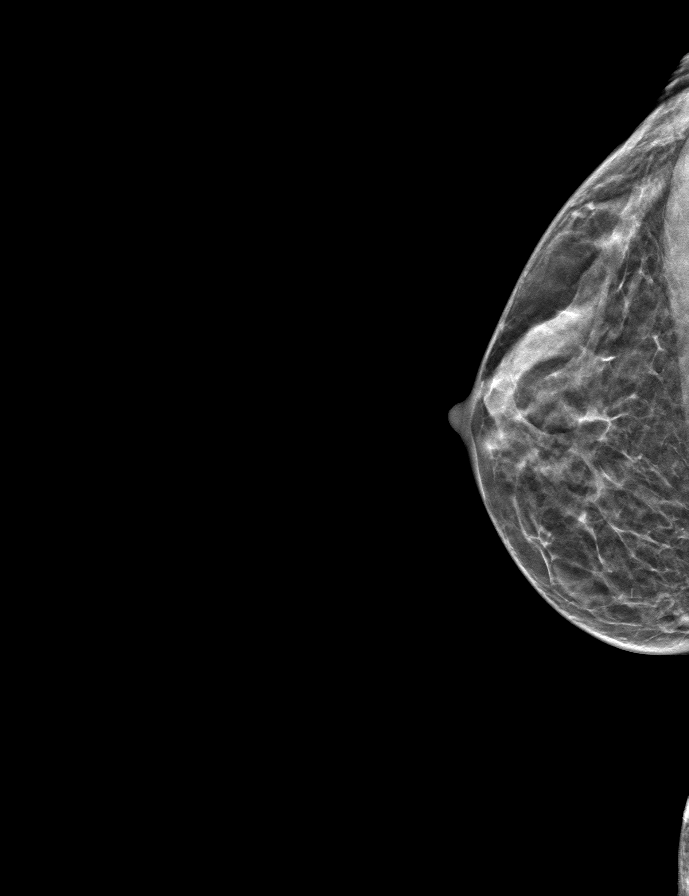

[R MLO tomo · tomo slice 16/31.0]
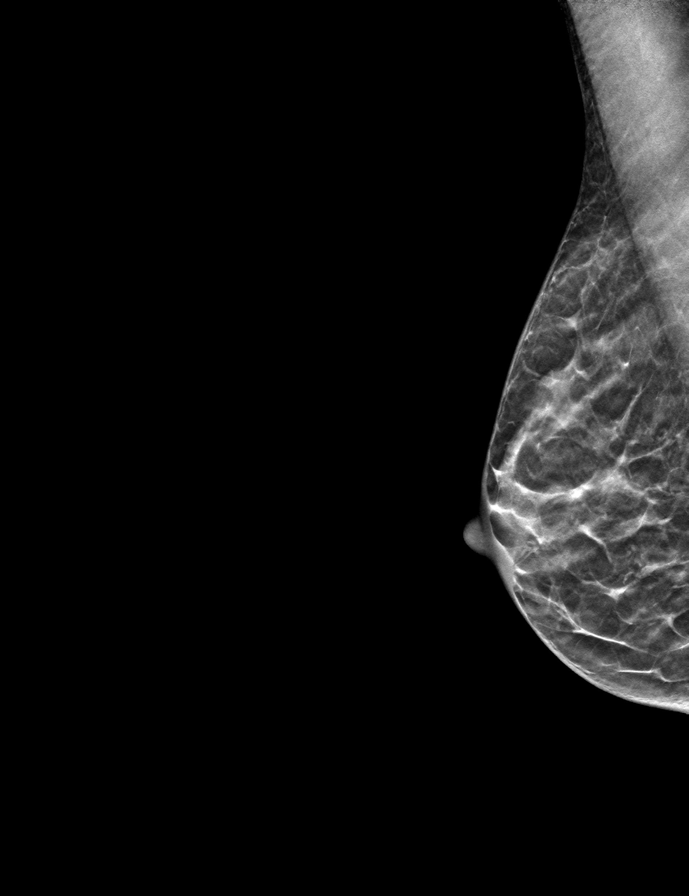

[9 of 24 positions shown; findings below may reference images not displayed]

ACR Breast Density Category c: The breast tissue is heterogeneously
dense, which may obscure small masses.
FINDINGS: There are no findings suspicious for malignancy.
IMPRESSION: No mammographic evidence of malignancy. A result letter of this
screening mammogram will be mailed directly to the patient.

RECOMMENDATION:
Screening mammogram in one year. (Code:Q3-W-BC3)

BI-RADS CATEGORY  1: Negative.

## 2023-02-23 DIAGNOSIS — G43709 Chronic migraine without aura, not intractable, without status migrainosus: Secondary | ICD-10-CM | POA: Diagnosis not present

## 2023-02-23 DIAGNOSIS — M542 Cervicalgia: Secondary | ICD-10-CM | POA: Diagnosis not present

## 2023-03-01 ENCOUNTER — Telehealth: Payer: Self-pay | Admitting: Gastroenterology

## 2023-03-01 NOTE — Telephone Encounter (Signed)
Hi Dr. Chales Abrahams,    Supervising Provider: 03/01/23   We received a referral for patient to be evaluated for colon cancer screening - last colonoscopy 2018 but first degree family history of colon cancer in father and son. Records were obtained from [place] and they were scanned into [facility] for you to review.   Please advise on scheduling.  Thanks

## 2023-03-02 NOTE — Telephone Encounter (Signed)
Needs OV - APP clinic to discuss risks and benefits d/t age>76 RG

## 2023-03-05 ENCOUNTER — Encounter: Payer: Self-pay | Admitting: Gastroenterology

## 2023-04-26 DIAGNOSIS — Z961 Presence of intraocular lens: Secondary | ICD-10-CM | POA: Diagnosis not present

## 2023-04-26 DIAGNOSIS — H04123 Dry eye syndrome of bilateral lacrimal glands: Secondary | ICD-10-CM | POA: Diagnosis not present

## 2023-04-26 DIAGNOSIS — H52203 Unspecified astigmatism, bilateral: Secondary | ICD-10-CM | POA: Diagnosis not present

## 2023-04-27 DIAGNOSIS — L814 Other melanin hyperpigmentation: Secondary | ICD-10-CM | POA: Diagnosis not present

## 2023-04-27 DIAGNOSIS — L570 Actinic keratosis: Secondary | ICD-10-CM | POA: Diagnosis not present

## 2023-04-27 DIAGNOSIS — D225 Melanocytic nevi of trunk: Secondary | ICD-10-CM | POA: Diagnosis not present

## 2023-04-27 DIAGNOSIS — D485 Neoplasm of uncertain behavior of skin: Secondary | ICD-10-CM | POA: Diagnosis not present

## 2023-04-27 DIAGNOSIS — D2262 Melanocytic nevi of left upper limb, including shoulder: Secondary | ICD-10-CM | POA: Diagnosis not present

## 2023-04-27 DIAGNOSIS — D1801 Hemangioma of skin and subcutaneous tissue: Secondary | ICD-10-CM | POA: Diagnosis not present

## 2023-04-27 DIAGNOSIS — L821 Other seborrheic keratosis: Secondary | ICD-10-CM | POA: Diagnosis not present

## 2023-05-18 DIAGNOSIS — G43709 Chronic migraine without aura, not intractable, without status migrainosus: Secondary | ICD-10-CM | POA: Diagnosis not present

## 2023-05-18 DIAGNOSIS — R2990 Unspecified symptoms and signs involving the nervous system: Secondary | ICD-10-CM | POA: Diagnosis not present

## 2023-06-01 ENCOUNTER — Ambulatory Visit (INDEPENDENT_AMBULATORY_CARE_PROVIDER_SITE_OTHER): Payer: Medicare Other | Admitting: Gastroenterology

## 2023-06-01 ENCOUNTER — Encounter: Payer: Self-pay | Admitting: Gastroenterology

## 2023-06-01 VITALS — BP 130/80 | HR 87 | Ht 64.0 in | Wt 104.4 lb

## 2023-06-01 DIAGNOSIS — Z800 Family history of malignant neoplasm of digestive organs: Secondary | ICD-10-CM | POA: Diagnosis not present

## 2023-06-01 DIAGNOSIS — K589 Irritable bowel syndrome without diarrhea: Secondary | ICD-10-CM

## 2023-06-01 DIAGNOSIS — R140 Abdominal distension (gaseous): Secondary | ICD-10-CM | POA: Diagnosis not present

## 2023-06-01 NOTE — Progress Notes (Signed)
Chief Complaint: For colonoscopy  Referring Provider:  Marden Noble, MD      ASSESSMENT AND PLAN;   #1. FH CRC (son at age 77, dad at age 4s)  #2. IBS with bloating   Plan: -Colon with miralax -Continue current diet.    Discussed risks & benefits of colonoscopy. Risks including rare perforation req laparotomy, bleeding after bx/polypectomy req blood transfusion, rarely missing neoplasms, risks of anesthesia/sedation, rare risk of damage to internal organs. Benefits outweigh the risks. Patient agrees to proceed. All the questions were answered. Pt consents to proceed.  HPI:    Rebecca Huang is a 77 y.o. female  With history of anxiety, arthritis, HTN, low back pain, previously diagnosed IBS-D (failed Viberzi, Levsin as needed)  Denies having any significant GI complaints except for postprandial abdominal bloating x yrs No diarrhea or constipation-has been careful eating.  She is on lactose-free diet. No blood in stool  RLQ bulge without any abdominal pain  Denies having any fever or chills. She denies having any heartburn regurgitation, odynophagia or dysphagia.  She does use Pepcid on as needed basis.  Has previously been on Zantac.  Here for colonoscopy for positive strong family history of colon cancer.  She is doing very well medically.  Has over 10-year life expectancy.   Past GI procedures: (In [place]) EGD: 01/07/2017 -Small HH -Gastritis. Neg Bx for HP  Colon 12/2016 Neg banding of internal hemorrhoids. Otherwise normal colonoscopy.   Past Medical History:  Diagnosis Date   Anxiety    Arthritis    Chronic headaches    Chronic kidney disease    Chronic low back pain    Family history of breast cancer    Family history of colon cancer    Family history of gene mutation    CDKN2A   Family history of melanoma    Family history of ovarian cancer    History of colon polyps    Hypertension    Hypertension    IBS (irritable bowel syndrome)     Migraine    Scoliosis     Past Surgical History:  Procedure Laterality Date   APPENDECTOMY     BREAST EXCISIONAL BIOPSY Left    COLONOSCOPY  01/07/2017   [facility]. Internal hemorrhoids. Banded. The examination was otherwise normal on direct and retroflexion views. No specimen collected   HIP SURGERY     KNEE SURGERY     TUBAL LIGATION     UPPER GASTROINTESTINAL ENDOSCOPY  01/07/2017   [facility]. 2cm hiatal hernia. Gastritis. Biopsied. Normal examined duodenum    Family History  Problem Relation Age of Onset   Heart disease Mother    Heart disease Sister    Other Sister        CDKN2A gene mutation   Melanoma Sister 3   Colon cancer Father 19   Breast cancer Maternal Aunt        dx >50   Melanoma Maternal Aunt        multiple   Colon cancer Maternal Uncle    Ovarian cancer Cousin 43       (maternal first cousin)   Ovarian cancer Cousin 26       dysgerminoma? (maternal first cousin)   Colon cancer Son 56    Social History   Tobacco Use   Smoking status: Never   Smokeless tobacco: Never  Substance Use Topics   Alcohol use: No   Drug use: No    Current  Outpatient Medications  Medication Sig Dispense Refill   aspirin EC 81 MG tablet Take 81 mg by mouth daily.     Calcium Carbonate-Vit D-Min (CALCIUM 1200 PO) Take by mouth.     cycloSPORINE (RESTASIS) 0.05 % ophthalmic emulsion 1 drop 2 (two) times daily.     fluticasone (FLONASE) 50 MCG/ACT nasal spray Place into both nostrils daily.     Magnesium 400 MG CAPS Take by mouth.     Multiple Vitamin (MULTIVITAMIN) tablet Take 1 tablet by mouth daily.     Omega-3 Fatty Acids (FISH OIL BURP-LESS PO) Take by mouth.     potassium chloride (K-DUR,KLOR-CON) 10 MEQ tablet Take 10 mEq by mouth daily.     Riboflavin (B2) 100 MG TABS Take by mouth.     zolmitriptan (ZOMIG-ZMT) 5 MG disintegrating tablet TAKE 1 TABLET BY MOUTH DAILY AS NEEDED FOR MIGRAINE 12 tablet 4   Current Facility-Administered Medications   Medication Dose Route Frequency Provider Last Rate Last Admin   botulinum toxin Type A (BOTOX) injection 200 Units  200 Units Intramuscular Once Huston Foley, MD        Allergies  Allergen Reactions   Demerol [Meperidine Hcl]    Tramadol     Review of Systems:  Constitutional: Denies fever, chills, diaphoresis, appetite change and fatigue.  HEENT: Denies photophobia, eye pain, redness, hearing loss, ear pain, congestion, sore throat, rhinorrhea, sneezing, mouth sores, neck pain, neck stiffness and tinnitus.   Respiratory: Denies SOB, DOE, cough, chest tightness,  and wheezing.   Cardiovascular: Denies chest pain, palpitations and leg swelling.  Genitourinary: Denies dysuria, urgency, frequency, hematuria, flank pain and difficulty urinating.  Musculoskeletal: Denies myalgias,  joint swelling, arthralgias and gait problem.  Has back pain. Skin: No rash.  Neurological: Denies dizziness, seizures, syncope, weakness, light-headedness, numbness and has headaches.  Hematological: Denies adenopathy. Easy bruising, personal or family bleeding history  Psychiatric/Behavioral: Has anxiety or depression     Physical Exam:    BP 130/80   Pulse 87  Wt Readings from Last 3 Encounters:  07/11/19 109 lb (49.4 kg)  04/05/19 108 lb (49 kg)  11/28/18 112 lb (50.8 kg)   Constitutional:  Well-developed, in no acute distress. Psychiatric: Normal mood and affect. Behavior is normal. HEENT: Pupils normal.  Conjunctivae are normal. No scleral icterus. Cardiovascular: Normal rate, regular rhythm. No edema Pulmonary/chest: Effort normal and breath sounds normal. No wheezing, rales or rhonchi. Abdominal: Soft, nondistended. Nontender. Bowel sounds active throughout. There are no masses palpable. No hepatomegaly. ?  Small hernia right lower quadrant.  Nontender.  Well-healed surgical scar from previous appendicectomy Rectal: Deferred Neurological: Alert and oriented to person place and time. Skin: Skin  is warm and dry. No rashes noted.  Data Reviewed: I have personally reviewed following labs and imaging studies Extensive notes were reviewed.    Edman Circle, MD 06/01/2023, 10:17 AM  Cc: Marden Noble, MD

## 2023-06-01 NOTE — Patient Instructions (Addendum)
_______________________________________________________  If your blood pressure at your visit was 140/90 or greater, please contact your primary care physician to follow up on this.  _______________________________________________________  If you are age 77 or older, your body mass index should be between 23-30. Your There is no height or weight on file to calculate BMI. If this is out of the aforementioned range listed, please consider follow up with your Primary Care Provider.  If you are age 67 or younger, your body mass index should be between 19-25. Your There is no height or weight on file to calculate BMI. If this is out of the aformentioned range listed, please consider follow up with your Primary Care Provider.   ________________________________________________________  The [facility] providers would like to encourage you to use [facility] to communicate with providers for non-urgent requests or questions.  Due to long hold times on the telephone, sending your provider a message by [facility] may be a faster and more efficient way to get a response.  Please allow 48 business hours for a response.  Please remember that this is for non-urgent requests.  _______________________________________________________  Bonita Quin have been scheduled for a colonoscopy. Please follow written instructions given to you at your visit today.   Please pick up your prep supplies at the pharmacy within the next 1-3 days.  If you use inhalers (even only as needed), please bring them with you on the day of your procedure.  DO NOT TAKE 7 DAYS PRIOR TO TEST- Trulicity (dulaglutide) Ozempic, Wegovy (semaglutide) Mounjaro (tirzepatide) Bydureon Bcise (exanatide extended release)  DO NOT TAKE 1 DAY PRIOR TO YOUR TEST Rybelsus (semaglutide) Adlyxin (lixisenatide) Victoza (liraglutide) Byetta (exanatide) ___________________________________________________________________________  Thank you,  Dr. Lynann Bologna

## 2023-07-28 DIAGNOSIS — I10 Essential (primary) hypertension: Secondary | ICD-10-CM | POA: Diagnosis not present

## 2023-07-28 DIAGNOSIS — F439 Reaction to severe stress, unspecified: Secondary | ICD-10-CM | POA: Diagnosis not present

## 2023-07-29 ENCOUNTER — Ambulatory Visit
Admission: RE | Admit: 2023-07-29 | Discharge: 2023-07-29 | Disposition: A | Discharge status: Home / Self Care | Payer: Medicare Other | Source: Ambulatory Visit | Attending: Internal Medicine | Admitting: Internal Medicine

## 2023-07-29 DIAGNOSIS — M8589 Other specified disorders of bone density and structure, multiple sites: Secondary | ICD-10-CM

## 2023-07-29 DIAGNOSIS — N958 Other specified menopausal and perimenopausal disorders: Secondary | ICD-10-CM | POA: Diagnosis not present

## 2023-07-29 DIAGNOSIS — E349 Endocrine disorder, unspecified: Secondary | ICD-10-CM | POA: Diagnosis not present

## 2023-07-29 DIAGNOSIS — M8588 Other specified disorders of bone density and structure, other site: Secondary | ICD-10-CM | POA: Diagnosis not present

## 2023-07-29 DIAGNOSIS — R29890 Loss of height: Secondary | ICD-10-CM | POA: Diagnosis not present

## 2023-08-04 ENCOUNTER — Encounter: Payer: Self-pay | Admitting: Gastroenterology

## 2023-08-04 ENCOUNTER — Ambulatory Visit (AMBULATORY_SURGERY_CENTER): Payer: Medicare Other | Admitting: Gastroenterology

## 2023-08-04 VITALS — BP 114/70 | HR 62 | Temp 98.6°F | Resp 8 | Ht 64.0 in | Wt 104.0 lb

## 2023-08-04 DIAGNOSIS — K589 Irritable bowel syndrome without diarrhea: Secondary | ICD-10-CM

## 2023-08-04 DIAGNOSIS — Z1211 Encounter for screening for malignant neoplasm of colon: Secondary | ICD-10-CM | POA: Diagnosis not present

## 2023-08-04 DIAGNOSIS — F419 Anxiety disorder, unspecified: Secondary | ICD-10-CM | POA: Diagnosis not present

## 2023-08-04 DIAGNOSIS — Z800 Family history of malignant neoplasm of digestive organs: Secondary | ICD-10-CM

## 2023-08-04 DIAGNOSIS — I10 Essential (primary) hypertension: Secondary | ICD-10-CM | POA: Diagnosis not present

## 2023-08-04 DIAGNOSIS — Z860100 Personal history of colon polyps, unspecified: Secondary | ICD-10-CM | POA: Diagnosis not present

## 2023-08-04 MED ORDER — SODIUM CHLORIDE 0.9 % IV SOLN: 500.0000 mL | Freq: Once | INTRAVENOUS | Status: DC

## 2023-08-04 NOTE — Op Note (Signed)
[facility] Patient Name: Rebecca Huang Procedure Date: 08/04/2023 10:12 AM MRN: [id number] Endoscopist: Lynann Bologna , MD, [id number] Age: 77 Referring MD:  Date of Birth: 1945-12-11 Gender: Female Account #: [id number] Procedure:                Colonoscopy Indications:              Screening in patient at increased risk: FH CRC (son                            at age 92, dad at age 43s). Neg colon 2018 Medicines:                Monitored Anesthesia Care Procedure:                Pre-Anesthesia Assessment:                           - Prior to the procedure, a History and Physical                            was performed, and patient medications and                            allergies were reviewed. The patient's tolerance of                            previous anesthesia was also reviewed. The risks                            and benefits of the procedure and the sedation                            options and risks were discussed with the patient.                            All questions were answered, and informed consent                            was obtained. Prior Anticoagulants: The patient has                            taken no anticoagulant or antiplatelet agents. ASA                            Grade Assessment: II - A patient with mild systemic                            disease. After reviewing the risks and benefits,                            the patient was deemed in satisfactory condition to                            undergo the procedure.  After obtaining informed consent, the colonoscope                            was passed under direct vision. Throughout the                            procedure, the patient's blood pressure, pulse, and                            oxygen saturations were monitored continuously. The                            Olympus Scope [id number] was introduced through the                            anus and  advanced to the the cecum, identified by                            appendiceal orifice and ileocecal valve. The                            colonoscopy was performed without difficulty. The                            patient tolerated the procedure well. The quality                            of the bowel preparation was good. The ileocecal                            valve, appendiceal orifice, and rectum were                            photographed. Scope In: 10:22:50 AM Scope Out: 10:36:01 AM Scope Withdrawal Time: 0 hours 8 minutes 23 seconds  Total Procedure Duration: 0 hours 13 minutes 11 seconds  Findings:                 The colon (entire examined portion) appeared                            normal. The colon was somewhat tortuous. 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                           Non-bleeding internal hemorrhoids were found during                            retroflexion. The hemorrhoids were small and Grade  I (internal hemorrhoids that do not prolapse). A                            scar was noted in the rectum consistent with                            previous banding                           The exam was otherwise without abnormality on                            direct and retroflexion views. Complications:            No immediate complications. Estimated Blood Loss:     Estimated blood loss: none. Impression:               - The entire examined colon is normal.                           - Non-bleeding internal hemorrhoids.                           - The examination was otherwise normal on direct                            and retroflexion views.                           - No specimens collected. Recommendation:           - Patient has a contact number available for                            emergencies. The signs and symptoms of potential                             delayed complications were discussed with the                            patient. Return to normal activities tomorrow.                            Written discharge instructions were provided to the                            patient.                           - Resume previous diet.                           - Continue present medications.                           - Repeat colonoscopy is not recommended for  screening purposes.                           - The findings and recommendations were discussed                            with the patient's family. Lynann Bologna, MD 08/04/2023 10:41:14 AM This report has been signed electronically.

## 2023-08-04 NOTE — Progress Notes (Signed)
Sedate, gd SR, tolerated procedure well, VSS, report to RN 

## 2023-08-04 NOTE — Patient Instructions (Signed)
Discharge instructions given. Handout on Hemorrhoids. Resume previous medications. YOU HAD AN ENDOSCOPIC PROCEDURE TODAY AT THE [facility]:   Refer to the procedure report that was given to you for any specific questions about what was found during the examination.  If the procedure report does not answer your questions, please call your gastroenterologist to clarify.  If you requested that your care partner not be given the details of your procedure findings, then the procedure report has been included in a sealed envelope for you to review at your convenience later.  YOU SHOULD EXPECT: Some feelings of bloating in the abdomen. Passage of more gas than usual.  Walking can help get rid of the air that was put into your GI tract during the procedure and reduce the bloating. If you had a lower endoscopy (such as a colonoscopy or flexible sigmoidoscopy) you may notice spotting of blood in your stool or on the toilet paper. If you underwent a bowel prep for your procedure, you may not have a normal bowel movement for a few days.  Please Note:  You might notice some irritation and congestion in your nose or some drainage.  This is from the oxygen used during your procedure.  There is no need for concern and it should clear up in a day or so.  SYMPTOMS TO REPORT IMMEDIATELY:  Following lower endoscopy (colonoscopy or flexible sigmoidoscopy):  Excessive amounts of blood in the stool  Significant tenderness or worsening of abdominal pains  Swelling of the abdomen that is new, acute  Fever of 100F or higher   For urgent or emergent issues, a gastroenterologist can be reached at any hour by calling [phone]. Do not use MyChart messaging for urgent concerns.    DIET:  We do recommend a small meal at first, but then you may proceed to your regular diet.  Drink plenty of fluids but you should avoid alcoholic beverages for 24 hours.  ACTIVITY:  You should plan to take it easy for the  rest of today and you should NOT DRIVE or use heavy machinery until tomorrow (because of the sedation medicines used during the test).    FOLLOW UP: Our staff will call the number listed on your records the next business day following your procedure.  We will call around 7:15- 8:00 am to check on you and address any questions or concerns that you may have regarding the information given to you following your procedure. If we do not reach you, we will leave a message.     If any biopsies were taken you will be contacted by phone or by letter within the next 1-3 weeks.  Please call us at [phone] if you have not heard about the biopsies in 3 weeks.    SIGNATURES/CONFIDENTIALITY: You and/or your care partner have signed paperwork which will be entered into your electronic medical record.  These signatures attest to the fact that that the information above on your After Visit Summary has been reviewed and is understood.  Full responsibility of the confidentiality of this discharge information lies with you and/or your care-partner. 

## 2023-08-04 NOTE — Progress Notes (Signed)
Chief Complaint: For colonoscopy  Referring Provider:  Lynann Bologna, MD      ASSESSMENT AND PLAN;   #1. FH CRC (son at age 77, dad at age 85s)  #2. IBS with bloating   Plan: -Colon with miralax -Continue current diet.    Discussed risks & benefits of colonoscopy. Risks including rare perforation req laparotomy, bleeding after bx/polypectomy req blood transfusion, rarely missing neoplasms, risks of anesthesia/sedation, rare risk of damage to internal organs. Benefits outweigh the risks. Patient agrees to proceed. All the questions were answered. Pt consents to proceed.  HPI:    Rebecca Huang is a 77 y.o. female  With history of anxiety, arthritis, HTN, low back pain, previously diagnosed IBS-D (failed Viberzi, Levsin as needed)  Denies having any significant GI complaints except for postprandial abdominal bloating x yrs No diarrhea or constipation-has been careful eating.  She is on lactose-free diet. No blood in stool  RLQ bulge without any abdominal pain  Denies having any fever or chills. She denies having any heartburn regurgitation, odynophagia or dysphagia.  She does use Pepcid on as needed basis.  Has previously been on Zantac.  Here for colonoscopy for positive strong family history of colon cancer.  She is doing very well medically.  Has over 10-year life expectancy.   Past GI procedures: (In [place]) EGD: 01/07/2017 -[facility] HH -Gastritis. Neg Bx for HP  Colon 12/2016 Neg banding of internal hemorrhoids. Otherwise normal colonoscopy.   Past Medical History:  Diagnosis Date   Anxiety    Arthritis    Chronic headaches    Chronic kidney disease    Chronic low back pain    Family history of breast cancer    Family history of colon cancer    Family history of gene mutation    CDKN2A   Family history of melanoma    Family history of ovarian cancer    History of colon polyps    Hypertension    Hypertension    IBS (irritable bowel syndrome)     Migraine    Scoliosis     Past Surgical History:  Procedure Laterality Date   APPENDECTOMY     BREAST EXCISIONAL BIOPSY Left    BUNIONECTOMY     CARPAL TUNNEL RELEASE Bilateral    CATARACT EXTRACTION     COLONOSCOPY  01/07/2017   [facility]. Internal hemorrhoids. Banded. The examination was otherwise normal on direct and retroflexion views. No specimen collected   HIP SURGERY     KNEE SURGERY     TROCHANTERIC BURSA EXCISION Right    TUBAL LIGATION     UPPER GASTROINTESTINAL ENDOSCOPY  01/07/2017   [facility]. 2cm hiatal hernia. Gastritis. Biopsied. Normal examined duodenum   VITRECTOMY Bilateral    eyes    Family History  Problem Relation Age of Onset   Heart disease Mother    Colon cancer Father 29   Heart disease Sister    Other Sister        CDKN2A gene mutation   Melanoma Sister 67   Breast cancer Maternal Aunt        dx >50   Melanoma Maternal Aunt        multiple   Colon cancer Maternal Uncle    Colon cancer Son 83   Ovarian cancer Cousin 28       (maternal first cousin)   Ovarian cancer Cousin 26       dysgerminoma? (maternal first cousin)   Esophageal cancer  Neg Hx    Rectal cancer Neg Hx    Stomach cancer Neg Hx     Social History   Tobacco Use   Smoking status: Never   Smokeless tobacco: Never  Vaping Use   Vaping status: Never Used  Substance Use Topics   Alcohol use: No   Drug use: No    Current Outpatient Medications  Medication Sig Dispense Refill   acetaminophen (TYLENOL) 500 MG tablet Take 500 mg by mouth as needed. Extra strength     AMBULATORY NON FORMULARY MEDICATION Take 1 tablet by mouth daily. Medication Name: Eye Health Complex     AMBULATORY NON FORMULARY MEDICATION Take 1 capsule by mouth 2 (two) times daily. Medication Name: HORSECHESTNUT     ascorbic acid (VITAMIN C) 500 MG tablet Take 500 mg by mouth daily.     aspirin EC 81 MG tablet Take 81 mg by mouth daily.     Calcium-Phosphorus-Vitamin D (CITRACAL +D3 PO)  Take 2 capsules by mouth 2 (two) times daily.     cycloSPORINE (RESTASIS) 0.05 % ophthalmic emulsion Place 1 drop into both eyes 2 (two) times daily.     EVENING PRIMROSE OIL PO Take 1,300 mg by mouth daily.     fluticasone (FLONASE) 50 MCG/ACT nasal spray Place 1 spray into both nostrils at bedtime.     hydrochlorothiazide (HYDRODIURIL) 12.5 MG tablet Take 12.5 mg by mouth daily.     MAGNESIUM CITRATE PO Take 160 mg by mouth daily.     Multiple Vitamins-Minerals (CENTRUM SILVER WOMEN 50+ PO) Take 1 tablet by mouth daily.     Omega-3 Fatty Acids (FISH OIL BURP-LESS PO) Take 1,200 mg by mouth 3 (three) times daily.     potassium chloride (K-DUR,KLOR-CON) 10 MEQ tablet Take 10 mEq by mouth daily.     Riboflavin (B2) 100 MG TABS Take 1 tablet by mouth 2 (two) times daily.     valsartan (DIOVAN) 80 MG tablet Take 80 mg by mouth 2 (two) times daily.     zolmitriptan (ZOMIG-ZMT) 5 MG disintegrating tablet TAKE 1 TABLET BY MOUTH DAILY AS NEEDED FOR MIGRAINE 12 tablet 4   No current facility-administered medications for this visit.    Allergies  Allergen Reactions   Demerol [Meperidine Hcl]    Tramadol     Review of Systems:  Constitutional: Denies fever, chills, diaphoresis, appetite change and fatigue.  HEENT: Denies photophobia, eye pain, redness, hearing loss, ear pain, congestion, sore throat, rhinorrhea, sneezing, mouth sores, neck pain, neck stiffness and tinnitus.   Respiratory: Denies SOB, DOE, cough, chest tightness,  and wheezing.   Cardiovascular: Denies chest pain, palpitations and leg swelling.  Genitourinary: Denies dysuria, urgency, frequency, hematuria, flank pain and difficulty urinating.  Musculoskeletal: Denies myalgias,  joint swelling, arthralgias and gait problem.  Has back pain. Skin: No rash.  Neurological: Denies dizziness, seizures, syncope, weakness, light-headedness, numbness and has headaches.  Hematological: Denies adenopathy. Easy bruising, personal or family  bleeding history  Psychiatric/Behavioral: Has anxiety or depression     Physical Exam:    BP (!) 144/88   Pulse 62   Temp 98.6 F (37 C)   Ht 5\' 4"  (1.626 m)   Wt 104 lb (47.2 kg)   SpO2 95%   BMI 17.85 kg/m  Wt Readings from Last 3 Encounters:  08/04/23 104 lb (47.2 kg)  06/01/23 104 lb 6 oz (47.3 kg)  07/11/19 109 lb (49.4 kg)   Constitutional:  Well-developed, in no acute distress. Psychiatric: Normal  mood and affect. Behavior is normal. HEENT: Pupils normal.  Conjunctivae are normal. No scleral icterus. Cardiovascular: Normal rate, regular rhythm. No edema Pulmonary/chest: Effort normal and breath sounds normal. No wheezing, rales or rhonchi. Abdominal: Soft, nondistended. Nontender. Bowel sounds active throughout. There are no masses palpable. No hepatomegaly. ?  Small hernia right lower quadrant.  Nontender.  Well-healed surgical scar from previous appendicectomy Rectal: Deferred Neurological: Alert and oriented to person place and time. Skin: Skin is warm and dry. No rashes noted.  Data Reviewed: I have personally reviewed following labs and imaging studies Extensive notes were reviewed.    Edman Circle, MD 08/04/2023, 10:06 AM  Cc: Lynann Bologna, MD

## 2023-08-05 ENCOUNTER — Telehealth: Payer: Self-pay

## 2023-08-05 NOTE — Telephone Encounter (Signed)
  Follow up Call-     08/04/2023    9:51 AM  Call back number  Post procedure Call Back phone  # [phone]  Permission to leave phone message Yes     Patient questions:  Do you have a fever, pain , or abdominal swelling? No. Pain Score  0 *  Have you tolerated food without any problems? Yes.    Have you been able to return to your normal activities? Yes.    Do you have any questions about your discharge instructions: Diet   No. Medications  No. Follow up visit  No.  Do you have questions or concerns about your Care? No.  Actions: * If pain score is 4 or above: No action needed, pain <4.

## 2023-08-11 DIAGNOSIS — G43709 Chronic migraine without aura, not intractable, without status migrainosus: Secondary | ICD-10-CM | POA: Diagnosis not present

## 2023-08-18 DIAGNOSIS — Z23 Encounter for immunization: Secondary | ICD-10-CM | POA: Diagnosis not present

## 2023-09-01 DIAGNOSIS — M8589 Other specified disorders of bone density and structure, multiple sites: Secondary | ICD-10-CM | POA: Diagnosis not present

## 2023-09-01 DIAGNOSIS — I10 Essential (primary) hypertension: Secondary | ICD-10-CM | POA: Diagnosis not present

## 2023-09-30 ENCOUNTER — Other Ambulatory Visit: Payer: Self-pay | Admitting: Obstetrics and Gynecology

## 2023-09-30 DIAGNOSIS — Z1231 Encounter for screening mammogram for malignant neoplasm of breast: Secondary | ICD-10-CM

## 2023-11-01 ENCOUNTER — Ambulatory Visit
Admission: RE | Admit: 2023-11-01 | Discharge: 2023-11-01 | Disposition: A | Discharge status: Home / Self Care | Payer: Medicare Other | Source: Ambulatory Visit | Attending: Obstetrics and Gynecology | Admitting: Obstetrics and Gynecology

## 2023-11-01 DIAGNOSIS — Z1231 Encounter for screening mammogram for malignant neoplasm of breast: Secondary | ICD-10-CM | POA: Diagnosis not present

## 2023-11-04 DIAGNOSIS — Z9189 Other specified personal risk factors, not elsewhere classified: Secondary | ICD-10-CM | POA: Diagnosis not present

## 2023-11-04 DIAGNOSIS — Z803 Family history of malignant neoplasm of breast: Secondary | ICD-10-CM | POA: Diagnosis not present

## 2023-11-10 DIAGNOSIS — G43709 Chronic migraine without aura, not intractable, without status migrainosus: Secondary | ICD-10-CM | POA: Diagnosis not present

## 2023-11-12 DIAGNOSIS — Z79899 Other long term (current) drug therapy: Secondary | ICD-10-CM | POA: Diagnosis not present

## 2024-01-18 DIAGNOSIS — M810 Age-related osteoporosis without current pathological fracture: Secondary | ICD-10-CM | POA: Diagnosis not present

## 2024-01-24 DIAGNOSIS — Z79899 Other long term (current) drug therapy: Secondary | ICD-10-CM | POA: Diagnosis not present

## 2024-01-24 DIAGNOSIS — R140 Abdominal distension (gaseous): Secondary | ICD-10-CM | POA: Diagnosis not present

## 2024-01-24 DIAGNOSIS — E559 Vitamin D deficiency, unspecified: Secondary | ICD-10-CM | POA: Diagnosis not present

## 2024-01-24 DIAGNOSIS — Z1331 Encounter for screening for depression: Secondary | ICD-10-CM | POA: Diagnosis not present

## 2024-01-24 DIAGNOSIS — E7889 Other lipoprotein metabolism disorders: Secondary | ICD-10-CM | POA: Diagnosis not present

## 2024-01-24 DIAGNOSIS — G43909 Migraine, unspecified, not intractable, without status migrainosus: Secondary | ICD-10-CM | POA: Diagnosis not present

## 2024-01-24 DIAGNOSIS — I10 Essential (primary) hypertension: Secondary | ICD-10-CM | POA: Diagnosis not present

## 2024-01-24 DIAGNOSIS — M8589 Other specified disorders of bone density and structure, multiple sites: Secondary | ICD-10-CM | POA: Diagnosis not present

## 2024-01-24 DIAGNOSIS — Z0000 Encounter for general adult medical examination without abnormal findings: Secondary | ICD-10-CM | POA: Diagnosis not present

## 2024-02-15 DIAGNOSIS — G43709 Chronic migraine without aura, not intractable, without status migrainosus: Secondary | ICD-10-CM | POA: Diagnosis not present

## 2024-03-02 DIAGNOSIS — G43709 Chronic migraine without aura, not intractable, without status migrainosus: Secondary | ICD-10-CM | POA: Diagnosis not present

## 2024-03-02 DIAGNOSIS — Z79899 Other long term (current) drug therapy: Secondary | ICD-10-CM | POA: Diagnosis not present

## 2024-03-27 DIAGNOSIS — Z23 Encounter for immunization: Secondary | ICD-10-CM | POA: Diagnosis not present

## 2024-04-26 DIAGNOSIS — H52203 Unspecified astigmatism, bilateral: Secondary | ICD-10-CM | POA: Diagnosis not present

## 2024-04-26 DIAGNOSIS — Z961 Presence of intraocular lens: Secondary | ICD-10-CM | POA: Diagnosis not present

## 2024-05-16 DIAGNOSIS — G43709 Chronic migraine without aura, not intractable, without status migrainosus: Secondary | ICD-10-CM | POA: Diagnosis not present

## 2024-06-02 DIAGNOSIS — Z79899 Other long term (current) drug therapy: Secondary | ICD-10-CM | POA: Diagnosis not present

## 2024-06-02 DIAGNOSIS — G43709 Chronic migraine without aura, not intractable, without status migrainosus: Secondary | ICD-10-CM | POA: Diagnosis not present

## 2024-06-05 DIAGNOSIS — D0471 Carcinoma in situ of skin of right lower limb, including hip: Secondary | ICD-10-CM | POA: Diagnosis not present

## 2024-06-05 DIAGNOSIS — L570 Actinic keratosis: Secondary | ICD-10-CM | POA: Diagnosis not present

## 2024-06-05 DIAGNOSIS — L821 Other seborrheic keratosis: Secondary | ICD-10-CM | POA: Diagnosis not present

## 2024-06-05 DIAGNOSIS — D044 Carcinoma in situ of skin of scalp and neck: Secondary | ICD-10-CM | POA: Diagnosis not present

## 2024-06-05 DIAGNOSIS — L820 Inflamed seborrheic keratosis: Secondary | ICD-10-CM | POA: Diagnosis not present

## 2024-08-03 DIAGNOSIS — R519 Headache, unspecified: Secondary | ICD-10-CM | POA: Diagnosis not present

## 2024-08-03 DIAGNOSIS — G43909 Migraine, unspecified, not intractable, without status migrainosus: Secondary | ICD-10-CM | POA: Diagnosis not present

## 2024-08-03 DIAGNOSIS — I8390 Asymptomatic varicose veins of unspecified lower extremity: Secondary | ICD-10-CM | POA: Diagnosis not present

## 2024-08-03 DIAGNOSIS — M8589 Other specified disorders of bone density and structure, multiple sites: Secondary | ICD-10-CM | POA: Diagnosis not present

## 2024-08-03 DIAGNOSIS — M4150 Other secondary scoliosis, site unspecified: Secondary | ICD-10-CM | POA: Diagnosis not present

## 2024-08-03 DIAGNOSIS — I10 Essential (primary) hypertension: Secondary | ICD-10-CM | POA: Diagnosis not present

## 2024-08-03 DIAGNOSIS — Z23 Encounter for immunization: Secondary | ICD-10-CM | POA: Diagnosis not present

## 2024-08-08 DIAGNOSIS — G43709 Chronic migraine without aura, not intractable, without status migrainosus: Secondary | ICD-10-CM | POA: Diagnosis not present

## 2024-09-01 DIAGNOSIS — Z79899 Other long term (current) drug therapy: Secondary | ICD-10-CM | POA: Diagnosis not present

## 2024-09-01 DIAGNOSIS — G43709 Chronic migraine without aura, not intractable, without status migrainosus: Secondary | ICD-10-CM | POA: Diagnosis not present

## 2024-09-18 DIAGNOSIS — M542 Cervicalgia: Secondary | ICD-10-CM | POA: Diagnosis not present

## 2024-09-18 DIAGNOSIS — M47812 Spondylosis without myelopathy or radiculopathy, cervical region: Secondary | ICD-10-CM | POA: Diagnosis not present

## 2024-10-09 ENCOUNTER — Other Ambulatory Visit: Payer: Self-pay | Admitting: Internal Medicine

## 2024-10-09 DIAGNOSIS — Z1231 Encounter for screening mammogram for malignant neoplasm of breast: Secondary | ICD-10-CM

## 2024-11-06 ENCOUNTER — Ambulatory Visit
Admission: RE | Admit: 2024-11-06 | Discharge: 2024-11-06 | Disposition: A | Source: Ambulatory Visit | Attending: Internal Medicine | Admitting: Internal Medicine

## 2024-11-06 DIAGNOSIS — Z1231 Encounter for screening mammogram for malignant neoplasm of breast: Secondary | ICD-10-CM
